# Patient Record
Sex: Female | Born: 1955
Health system: Southern US, Community
[De-identification: ages and names within clinical notes are randomized; demographics above are authoritative.]

## PROBLEM LIST (undated history)

## (undated) DIAGNOSIS — F419 Anxiety disorder, unspecified: Secondary | ICD-10-CM

## (undated) DIAGNOSIS — M5126 Other intervertebral disc displacement, lumbar region: Secondary | ICD-10-CM

## (undated) DIAGNOSIS — C801 Malignant (primary) neoplasm, unspecified: Secondary | ICD-10-CM

## (undated) DIAGNOSIS — M1990 Unspecified osteoarthritis, unspecified site: Secondary | ICD-10-CM

## (undated) DIAGNOSIS — K219 Gastro-esophageal reflux disease without esophagitis: Secondary | ICD-10-CM

## (undated) DIAGNOSIS — G894 Chronic pain syndrome: Secondary | ICD-10-CM

## (undated) DIAGNOSIS — F329 Major depressive disorder, single episode, unspecified: Secondary | ICD-10-CM

## (undated) DIAGNOSIS — F32A Depression, unspecified: Secondary | ICD-10-CM

## (undated) DIAGNOSIS — R519 Headache, unspecified: Secondary | ICD-10-CM

## (undated) DIAGNOSIS — N189 Chronic kidney disease, unspecified: Secondary | ICD-10-CM

## (undated) DIAGNOSIS — R4189 Other symptoms and signs involving cognitive functions and awareness: Secondary | ICD-10-CM

## (undated) HISTORY — PX: TUBAL LIGATION: SHX77

## (undated) HISTORY — PX: COLONOSCOPY: SHX174

## (undated) HISTORY — PX: OTHER SURGICAL HISTORY: SHX169

---

## 1999-03-01 ENCOUNTER — Other Ambulatory Visit: Admission: RE | Admit: 1999-03-01 | Discharge: 1999-03-01 | Payer: Self-pay | Admitting: Obstetrics and Gynecology

## 2000-03-01 ENCOUNTER — Other Ambulatory Visit: Admission: RE | Admit: 2000-03-01 | Discharge: 2000-03-01 | Payer: Self-pay | Admitting: Obstetrics and Gynecology

## 2001-03-03 ENCOUNTER — Other Ambulatory Visit: Admission: RE | Admit: 2001-03-03 | Discharge: 2001-03-03 | Payer: Self-pay | Admitting: Obstetrics and Gynecology

## 2002-03-19 ENCOUNTER — Other Ambulatory Visit: Admission: RE | Admit: 2002-03-19 | Discharge: 2002-03-19 | Payer: Self-pay | Admitting: Family Medicine

## 2004-04-13 ENCOUNTER — Other Ambulatory Visit: Admission: RE | Admit: 2004-04-13 | Discharge: 2004-04-13 | Payer: Self-pay | Admitting: Family Medicine

## 2004-11-07 ENCOUNTER — Encounter: Admission: RE | Admit: 2004-11-07 | Discharge: 2004-11-07 | Payer: Self-pay | Admitting: Family Medicine

## 2005-04-19 ENCOUNTER — Emergency Department (HOSPITAL_COMMUNITY): Admission: EM | Admit: 2005-04-19 | Discharge: 2005-04-19 | Payer: Self-pay | Admitting: Emergency Medicine

## 2005-11-23 ENCOUNTER — Emergency Department (HOSPITAL_COMMUNITY): Admission: EM | Admit: 2005-11-23 | Discharge: 2005-11-24 | Payer: Self-pay | Admitting: Emergency Medicine

## 2007-12-23 ENCOUNTER — Other Ambulatory Visit: Admission: RE | Admit: 2007-12-23 | Discharge: 2007-12-23 | Payer: Self-pay | Admitting: Family Medicine

## 2009-06-30 ENCOUNTER — Other Ambulatory Visit: Admission: RE | Admit: 2009-06-30 | Discharge: 2009-06-30 | Payer: Self-pay | Admitting: Family Medicine

## 2011-02-15 ENCOUNTER — Other Ambulatory Visit: Payer: Self-pay | Admitting: Family Medicine

## 2011-02-15 ENCOUNTER — Other Ambulatory Visit (HOSPITAL_COMMUNITY)
Admission: RE | Admit: 2011-02-15 | Discharge: 2011-02-15 | Disposition: A | Discharge status: Home / Self Care | Payer: 59 | Source: Ambulatory Visit | Attending: Family Medicine | Admitting: Family Medicine

## 2011-02-15 DIAGNOSIS — Z01419 Encounter for gynecological examination (general) (routine) without abnormal findings: Secondary | ICD-10-CM | POA: Insufficient documentation

## 2013-04-07 ENCOUNTER — Other Ambulatory Visit: Payer: Self-pay | Admitting: Family Medicine

## 2013-04-07 DIAGNOSIS — R1011 Right upper quadrant pain: Secondary | ICD-10-CM

## 2013-04-07 DIAGNOSIS — R1031 Right lower quadrant pain: Secondary | ICD-10-CM

## 2013-04-09 ENCOUNTER — Ambulatory Visit
Admission: RE | Admit: 2013-04-09 | Discharge: 2013-04-09 | Disposition: A | Discharge status: Home / Self Care | Payer: BC Managed Care – PPO | Source: Ambulatory Visit | Attending: Family Medicine | Admitting: Family Medicine

## 2013-04-09 DIAGNOSIS — R1031 Right lower quadrant pain: Secondary | ICD-10-CM

## 2013-04-09 DIAGNOSIS — R1011 Right upper quadrant pain: Secondary | ICD-10-CM

## 2013-04-10 ENCOUNTER — Other Ambulatory Visit: Payer: 59

## 2013-07-17 ENCOUNTER — Other Ambulatory Visit: Payer: Self-pay | Admitting: Family Medicine

## 2013-07-17 ENCOUNTER — Other Ambulatory Visit (HOSPITAL_COMMUNITY)
Admission: RE | Admit: 2013-07-17 | Discharge: 2013-07-17 | Disposition: A | Discharge status: Home / Self Care | Payer: BC Managed Care – PPO | Source: Ambulatory Visit | Attending: Family Medicine | Admitting: Family Medicine

## 2013-07-17 DIAGNOSIS — Z124 Encounter for screening for malignant neoplasm of cervix: Secondary | ICD-10-CM | POA: Insufficient documentation

## 2014-08-26 ENCOUNTER — Other Ambulatory Visit (HOSPITAL_COMMUNITY)
Admission: RE | Admit: 2014-08-26 | Discharge: 2014-08-26 | Disposition: A | Discharge status: Home / Self Care | Payer: 59 | Source: Ambulatory Visit | Attending: Family Medicine | Admitting: Family Medicine

## 2014-08-26 ENCOUNTER — Other Ambulatory Visit: Payer: Self-pay | Admitting: Family Medicine

## 2014-08-26 DIAGNOSIS — Z124 Encounter for screening for malignant neoplasm of cervix: Secondary | ICD-10-CM | POA: Diagnosis not present

## 2014-08-31 LAB — CYTOLOGY - PAP

## 2016-07-31 ENCOUNTER — Ambulatory Visit: Payer: Self-pay | Admitting: Orthopedic Surgery

## 2016-07-31 NOTE — Progress Notes (Signed)
Please place orders in EPIC as patient is being scheduled for a Pre-op appointment! Thank you! 

## 2016-08-02 NOTE — Patient Instructions (Addendum)
Kristen Wilcox  08/02/2016   Your procedure is scheduled on: 08-15-16  Report to [facility] Main  Entrance take [facility]  elevators to 3rd floor to  [place] at [phone].  Call this number if you have problems the morning of surgery [phone]   Remember: ONLY 1 PERSON MAY GO WITH YOU TO SHORT STAY TO GET  READY MORNING OF [place].  Do not eat food or drink liquids :After Midnight.     Take these medicines the morning of surgery with A SIP OF WATER: gabapentin(Neurontin), xanax as needed, hydrocodone as needed, cogentin, haloperido(hadol)                                You may not have any metal on your body including hair pins and              piercings  Do not wear jewelry, make-up, lotions, powders or perfumes, deodorant             Do not wear nail polish.  Do not shave  48 hours prior to surgery.              Men may shave face and neck.   Do not bring valuables to the hospital. Kristen Wilcox.  Contacts, dentures or bridgework may not be worn into surgery.  Leave suitcase in the car. After surgery it may be brought to your room.               Please read over the following fact sheets you were given: _____________________________________________________________________             [facility] - Preparing for Surgery Before surgery, you can play an important role.  Because skin is not sterile, your skin needs to be as free of germs as possible.  You can reduce the number of germs on your skin by washing with CHG (chlorahexidine gluconate) soap before surgery.  CHG is an antiseptic cleaner which kills germs and bonds with the skin to continue killing germs even after washing. Please DO NOT use if you have an allergy to CHG or antibacterial soaps.  If your skin becomes reddened/irritated stop using the CHG and inform your nurse when you arrive at Short Stay. Do not shave (including legs and underarms)  for at least 48 hours prior to the first CHG shower.  You may shave your face/neck. Please follow these instructions carefully:  1.  Shower with CHG Soap the night before surgery and the  morning of Surgery.  2.  If you choose to wash your hair, wash your hair first as usual with your  normal  shampoo.  3.  After you shampoo, rinse your hair and body thoroughly to remove the  shampoo.                           4.  Use CHG as you would any other liquid soap.  You can apply chg directly  to the skin and wash                       Gently with a scrungie or clean washcloth.  5.  Apply the CHG Soap to your body ONLY FROM THE NECK DOWN.   Do not use on face/ open                           Wound or open sores. Avoid contact with eyes, ears mouth and genitals (private parts).                       Wash face,  Genitals (private parts) with your normal soap.             6.  Wash thoroughly, paying special attention to the area where your surgery  will be performed.  7.  Thoroughly rinse your body with warm water from the neck down.  8.  DO NOT shower/wash with your normal soap after using and rinsing off  the CHG Soap.                9.  Pat yourself dry with a clean towel.            10.  Wear clean pajamas.            11.  Place clean sheets on your bed the night of your first shower and do not  sleep with pets. Day of Surgery : Do not apply any lotions/deodorants the morning of surgery.  Please wear clean clothes to the hospital/surgery center.  FAILURE TO FOLLOW THESE INSTRUCTIONS MAY RESULT IN THE CANCELLATION OF YOUR SURGERY PATIENT SIGNATURE_________________________________  NURSE SIGNATURE__________________________________  ________________________________________________________________________   Kristen Wilcox  An incentive spirometer is a tool that can help keep your lungs clear and active. This tool measures how well you are filling your lungs with each breath. Taking long deep  breaths may help reverse or decrease the chance of developing breathing (pulmonary) problems (especially infection) following:  A long period of time when you are unable to move or be active. BEFORE THE PROCEDURE   If the spirometer includes an indicator to show your best effort, your nurse or respiratory therapist will set it to a desired goal.  If possible, sit up straight or lean slightly forward. Try not to slouch.  Hold the incentive spirometer in an upright position. INSTRUCTIONS FOR USE  1. Sit on the edge of your bed if possible, or sit up as far as you can in bed or on a chair. 2. Hold the incentive spirometer in an upright position. 3. Breathe out normally. 4. Place the mouthpiece in your mouth and seal your lips tightly around it. 5. Breathe in slowly and as deeply as possible, raising the piston or the ball toward the top of the column. 6. Hold your breath for 3-5 seconds or for as long as possible. Allow the piston or ball to fall to the bottom of the column. 7. Remove the mouthpiece from your mouth and breathe out normally. 8. Rest for a few seconds and repeat Steps 1 through 7 at least 10 times every 1-2 hours when you are awake. Take your time and take a few normal breaths between deep breaths. 9. The spirometer may include an indicator to show your best effort. Use the indicator as a goal to work toward during each repetition. 10. After each set of 10 deep breaths, practice coughing to be sure your lungs are clear. If you have an incision (the cut made at the time of surgery), support your incision when coughing by placing a pillow or  rolled up towels firmly against it. Once you are able to get out of bed, walk around indoors and cough well. You may stop using the incentive spirometer when instructed by your caregiver.  RISKS AND COMPLICATIONS  Take your time so you do not get dizzy or light-headed.  If you are in pain, you may need to take or ask for pain medication before  doing incentive spirometry. It is harder to take a deep breath if you are having pain. AFTER USE  Rest and breathe slowly and easily.  It can be helpful to keep track of a log of your progress. Your caregiver can provide you with a simple table to help with this. If you are using the spirometer at home, follow these instructions: [place] IF:   You are having difficultly using the spirometer.  You have trouble using the spirometer as often as instructed.  Your pain medication is not giving enough relief while using the spirometer.  You develop fever of 100.5 F (38.1 C) or higher. SEEK IMMEDIATE MEDICAL CARE IF:   You cough up bloody sputum that had not been present before.  You develop fever of 102 F (38.9 C) or greater.  You develop worsening pain at or near the incision site. MAKE SURE YOU:   Understand these instructions.  Will watch your condition.  Will get help right away if you are not doing well or get worse. Document Released: 10/08/2006 Document Revised: 08/20/2011 Document Reviewed: 12/09/2006 [facility] Patient Information 2014 [place], [place].   ________________________________________________________________________

## 2016-08-06 ENCOUNTER — Encounter (INDEPENDENT_AMBULATORY_CARE_PROVIDER_SITE_OTHER): Payer: Self-pay

## 2016-08-06 ENCOUNTER — Ambulatory Visit (HOSPITAL_COMMUNITY)
Admission: RE | Admit: 2016-08-06 | Discharge: 2016-08-06 | Disposition: A | Discharge status: Home / Self Care | Payer: 59 | Source: Ambulatory Visit | Attending: Orthopedic Surgery | Admitting: Orthopedic Surgery

## 2016-08-06 ENCOUNTER — Ambulatory Visit: Payer: Self-pay | Admitting: Orthopedic Surgery

## 2016-08-06 ENCOUNTER — Encounter (HOSPITAL_COMMUNITY): Payer: Self-pay

## 2016-08-06 ENCOUNTER — Encounter (HOSPITAL_COMMUNITY)
Admission: RE | Admit: 2016-08-06 | Discharge: 2016-08-06 | Disposition: A | Discharge status: Home / Self Care | Payer: 59 | Source: Ambulatory Visit | Attending: Specialist | Admitting: Specialist

## 2016-08-06 DIAGNOSIS — M419 Scoliosis, unspecified: Secondary | ICD-10-CM | POA: Insufficient documentation

## 2016-08-06 DIAGNOSIS — Z01812 Encounter for preprocedural laboratory examination: Secondary | ICD-10-CM | POA: Diagnosis present

## 2016-08-06 DIAGNOSIS — M5126 Other intervertebral disc displacement, lumbar region: Secondary | ICD-10-CM

## 2016-08-06 DIAGNOSIS — Z01818 Encounter for other preprocedural examination: Secondary | ICD-10-CM | POA: Diagnosis present

## 2016-08-06 DIAGNOSIS — M5136 Other intervertebral disc degeneration, lumbar region: Secondary | ICD-10-CM | POA: Diagnosis not present

## 2016-08-06 HISTORY — DX: Depression, unspecified: F32.A

## 2016-08-06 HISTORY — DX: Major depressive disorder, single episode, unspecified: F32.9

## 2016-08-06 HISTORY — DX: Other intervertebral disc displacement, lumbar region: M51.26

## 2016-08-06 HISTORY — DX: Malignant (primary) neoplasm, unspecified: C80.1

## 2016-08-06 HISTORY — DX: Anxiety disorder, unspecified: F41.9

## 2016-08-06 LAB — SURGICAL PCR SCREEN
MRSA, PCR: NEGATIVE
Staphylococcus aureus: NEGATIVE

## 2016-08-06 LAB — BASIC METABOLIC PANEL
ANION GAP: 5 (ref 5–15)
BUN: 15 mg/dL (ref 6–20)
CALCIUM: 9.4 mg/dL (ref 8.9–10.3)
CHLORIDE: 107 mmol/L (ref 101–111)
CO2: 30 mmol/L (ref 22–32)
Creatinine, Ser: 0.75 mg/dL (ref 0.44–1.00)
GFR calc non Af Amer: >60 mL/min (ref >60)
GLUCOSE: 94 mg/dL (ref 65–99)
POTASSIUM: 4.6 mmol/L (ref 3.5–5.1)
Sodium: 142 mmol/L (ref 135–145)

## 2016-08-06 LAB — CBC
HEMATOCRIT: 40.1 % (ref 36.0–46.0)
HEMOGLOBIN: 13.3 g/dL (ref 12.0–15.0)
MCH: 29.2 pg (ref 26.0–34.0)
MCHC: 33.2 g/dL (ref 30.0–36.0)
MCV: 88.1 fL (ref 78.0–100.0)
Platelets: 216 10*3/uL (ref 150–400)
RBC: 4.55 MIL/uL (ref 3.87–5.11)
RDW: 12.8 % (ref 11.5–15.5)
WBC: 4.5 10*3/uL (ref 4.0–10.5)

## 2016-08-06 NOTE — H&P (Signed)
Kristen Wilcox is an 60 y.o. female.   Chief Complaint: back and left leg pain HPI: The patient is a 60 year old female who presents today for follow up of their back. The patient is being followed for their low back symptoms. They are now 4 week(s) out from when symptoms began. Symptoms reported today include: pain. The patient states that they are doing poorly. Current treatment includes: relative rest, activity modification, pain medications and muscle relaxer. The following medication has been used for pain control: Norco and Robaxin. The patient presents today following MRI.  Kristen Wilcox follows up. She has had a couple of episodes where she had severe pain, could not walk. The leg is giving out on her. Overall, she reports she has been having the symptoms for six months to a year, has been on gabapentin for that, has been attempting home exercises and activity modification. She was seen and treated by Dr. Mitchell. She was also taking hydrocodone for this.  Review of systems is negative for fevers, chest pain, shortness of breath, unexplained recent weight loss, loss of bowel or bladder function, burning with urination, joint swelling, rashes, weakness or numbness, difficulty with balance, easy bruising, excessive thirst or frequent urination.  She reports no numbness, tingling in the upper extremities.  No past medical history on file.  No past surgical history on file.  No family history on file. Social History:  has no tobacco, alcohol, and drug history on file.  Allergies:  Allergies  Allergen Reactions  . Other     Migraine prevention medications Causes tingling in face and throat   . Penicillins Swelling    Has patient had a PCN reaction causing immediate rash, facial/tongue/throat swelling, SOB or lightheadedness with hypotension: Yes Has patient had a PCN reaction causing severe rash involving mucus membranes or skin necrosis: No Has patient had a PCN reaction that required  hospitalization No Has patient had a PCN reaction occurring within the last 10 years: Yes If all of the above answers are "NO", then may proceed with Cephalosporin use.   . Septra [Sulfamethoxazole-Trimethoprim] Other (See Comments)    Feels like head is going to explode      (Not in a hospital admission)  No results found for this or any previous visit (from the past 48 hour(s)). No results found.  Review of Systems  Constitutional: Negative.   HENT: Negative.   Eyes: Negative.   Respiratory: Negative.   Cardiovascular: Negative.   Gastrointestinal: Negative.   Genitourinary: Negative.   Musculoskeletal: Positive for back pain.  Skin: Negative.   Neurological: Positive for sensory change and focal weakness.  Psychiatric/Behavioral: Negative.     There were no vitals taken for this visit. Physical Exam  Constitutional: She is oriented to person, place, and time. She appears well-developed. She appears distressed.  HENT:  Head: Normocephalic.  Eyes: Pupils are equal, round, and reactive to light.  Neck: Normal range of motion.  Cardiovascular: Normal rate.   Respiratory: Effort normal.  GI: Soft.  Musculoskeletal:  She is still in moderate distress. Mood and affect is appropriate. Tender in the left proximal gluteus. Straight leg raise produces marked buttock, thigh and calf pain, exacerbated with dorsal augmentation maneuver. Decreased Achilles reflex on the left. Decreased plantar flexion. She has 4/5 dorsiflexion on the left, compared to the right. Slightly overt sensation in the heel.  Neurological: She is alert and oriented to person, place, and time.    X-rays of the lumbar spine, AP   and lateral flexion and extension demonstrates spondylosis at multiple levels, mild. No instability of flexion and extension. No fracture. Hips are unremarkable  MRI demonstrates an extruded disc herniation at L5-S1 on the left, displaced in the S1 nerve root and also severe lateral  recess stenosis at L5-S1. There is caudal migration, extending into the foramen. There is disc degeneration at L4-5, small protrusion at L3-4.  Assessment/Plan Refractory L5-S1 radiculopathy, myotomal weakness, dermatomal dysesthesias, refractory to rest, activity modification, home exercise program, prednisone, gabapentin, hydrocodone.  Given the persistence of her symptoms in the presence of neurologic deficit with a neural compressive lesion, we discussed a lumbar decompression.  I had an extensive discussion of the risks and benefits of the lumbar decompression with the patient including bleeding, infection, damage to neurovascular structures, epidural fibrosis, CSF leak requiring repair. We also discussed increase in pain, adjacent segment disease, recurrent disc herniation, need for future surgery including repeat decompression and/or fusion. We also discussed risks of postoperative hematoma, paralysis, anesthetic complications including DVT, PE, death, cardiopulmonary dysfunction. In addition, the perioperative and postoperative courses were discussed in detail including the rehabilitative time and return to functional activity and work. I provided the patient with an illustrated handout and utilized the appropriate surgical models.  We discussed residual neuropathic pain and given there has been a fair amount of time, it may take a while to recover. She may have some residual permanent deficit, although I suspect it should return. She is having a difficult time with neural stretch and walking and discussed reducing her stretching. Continue wearing analgesics and rest and prone extensions. She is otherwise pretty healthy. We will proceed accordingly. She is to continue with her hydrocodone to be taken as directed. Discussed this with her husband. I gave her note that she should be out of work.  Plan  microlumbar decompression L5-S1 left  Danh Bayus M., PA-C for Dr. Beane 08/06/2016, 8:34  AM   

## 2016-08-15 ENCOUNTER — Encounter (HOSPITAL_COMMUNITY)
Admission: RE | Disposition: A | Discharge status: Home / Self Care | Payer: Self-pay | Source: Ambulatory Visit | Attending: Specialist

## 2016-08-15 ENCOUNTER — Ambulatory Visit (HOSPITAL_COMMUNITY): Payer: 59

## 2016-08-15 ENCOUNTER — Observation Stay (HOSPITAL_COMMUNITY)
Admission: RE | Admit: 2016-08-15 | Discharge: 2016-08-16 | Disposition: A | Discharge status: Home / Self Care | Payer: 59 | Source: Ambulatory Visit | Attending: Specialist | Admitting: Specialist

## 2016-08-15 ENCOUNTER — Ambulatory Visit (HOSPITAL_COMMUNITY): Payer: 59 | Admitting: Anesthesiology

## 2016-08-15 ENCOUNTER — Encounter (HOSPITAL_COMMUNITY): Payer: Self-pay | Admitting: *Deleted

## 2016-08-15 DIAGNOSIS — Z79899 Other long term (current) drug therapy: Secondary | ICD-10-CM | POA: Diagnosis not present

## 2016-08-15 DIAGNOSIS — M5127 Other intervertebral disc displacement, lumbosacral region: Secondary | ICD-10-CM | POA: Diagnosis not present

## 2016-08-15 DIAGNOSIS — F418 Other specified anxiety disorders: Secondary | ICD-10-CM | POA: Diagnosis not present

## 2016-08-15 DIAGNOSIS — M5126 Other intervertebral disc displacement, lumbar region: Secondary | ICD-10-CM | POA: Diagnosis present

## 2016-08-15 DIAGNOSIS — Z87891 Personal history of nicotine dependence: Secondary | ICD-10-CM | POA: Insufficient documentation

## 2016-08-15 DIAGNOSIS — M5136 Other intervertebral disc degeneration, lumbar region: Secondary | ICD-10-CM | POA: Diagnosis not present

## 2016-08-15 DIAGNOSIS — M4807 Spinal stenosis, lumbosacral region: Secondary | ICD-10-CM | POA: Diagnosis not present

## 2016-08-15 DIAGNOSIS — Z880 Allergy status to penicillin: Secondary | ICD-10-CM | POA: Diagnosis not present

## 2016-08-15 DIAGNOSIS — M549 Dorsalgia, unspecified: Secondary | ICD-10-CM

## 2016-08-15 DIAGNOSIS — Z791 Long term (current) use of non-steroidal anti-inflammatories (NSAID): Secondary | ICD-10-CM | POA: Diagnosis not present

## 2016-08-15 HISTORY — PX: LUMBAR LAMINECTOMY/DECOMPRESSION MICRODISCECTOMY: SHX5026

## 2016-08-15 SURGERY — LUMBAR LAMINECTOMY/DECOMPRESSION MICRODISCECTOMY 1 LEVEL
Anesthesia: General | Site: Back | Laterality: Left

## 2016-08-15 MED ORDER — ONDANSETRON HCL 4 MG/2ML IJ SOLN
INTRAMUSCULAR | Status: DC | PRN
  Administered 2016-08-15: 4 mg via INTRAVENOUS

## 2016-08-15 MED ORDER — HYDROMORPHONE HCL 1 MG/ML IJ SOLN: 0.5000 mg | INTRAMUSCULAR | Status: DC | PRN

## 2016-08-15 MED ORDER — ACETAMINOPHEN 325 MG PO TABS: 650.0000 mg | ORAL_TABLET | ORAL | Status: DC | PRN

## 2016-08-15 MED ORDER — BENZTROPINE MESYLATE 1 MG PO TABS
1.0000 mg | ORAL_TABLET | Freq: Every day | ORAL | Status: DC
  Administered 2016-08-16: 1 mg via ORAL
  Filled 2016-08-15: qty 1

## 2016-08-15 MED ORDER — HYDROMORPHONE HCL 1 MG/ML IJ SOLN
INTRAMUSCULAR | Status: AC
  Administered 2016-08-15: 0.25 mg via INTRAVENOUS
  Filled 2016-08-15: qty 1

## 2016-08-15 MED ORDER — ROCURONIUM BROMIDE 10 MG/ML (PF) SYRINGE
PREFILLED_SYRINGE | INTRAVENOUS | Status: DC | PRN
  Administered 2016-08-15: 30 mg via INTRAVENOUS

## 2016-08-15 MED ORDER — KCL IN DEXTROSE-NACL 20-5-0.45 MEQ/L-%-% IV SOLN
INTRAVENOUS | Status: DC
  Administered 2016-08-15: 16:00:00 via INTRAVENOUS
  Filled 2016-08-15: qty 1000

## 2016-08-15 MED ORDER — PHENYLEPHRINE 40 MCG/ML (10ML) SYRINGE FOR IV PUSH (FOR BLOOD PRESSURE SUPPORT)
PREFILLED_SYRINGE | INTRAVENOUS | Status: DC | PRN
  Administered 2016-08-15 (×2): 80 ug via INTRAVENOUS

## 2016-08-15 MED ORDER — ALPRAZOLAM 0.5 MG PO TABS: 0.5000 mg | ORAL_TABLET | Freq: Three times a day (TID) | ORAL | Status: DC | PRN

## 2016-08-15 MED ORDER — LIDOCAINE 2% (20 MG/ML) 5 ML SYRINGE
INTRAMUSCULAR | Status: DC | PRN
  Administered 2016-08-15: 100 mg via INTRAVENOUS

## 2016-08-15 MED ORDER — PROMETHAZINE HCL 25 MG/ML IJ SOLN: 6.2500 mg | INTRAMUSCULAR | Status: DC | PRN

## 2016-08-15 MED ORDER — VANCOMYCIN HCL IN DEXTROSE 1-5 GM/200ML-% IV SOLN
1000.0000 mg | Freq: Once | INTRAVENOUS | Status: AC
  Administered 2016-08-15: 1000 mg via INTRAVENOUS
  Filled 2016-08-15: qty 200

## 2016-08-15 MED ORDER — ONDANSETRON HCL 4 MG/2ML IJ SOLN: 4.0000 mg | Freq: Four times a day (QID) | INTRAMUSCULAR | Status: DC | PRN

## 2016-08-15 MED ORDER — GABAPENTIN 400 MG PO CAPS
800.0000 mg | ORAL_CAPSULE | Freq: Three times a day (TID) | ORAL | Status: DC
  Administered 2016-08-15 – 2016-08-16 (×3): 800 mg via ORAL
  Filled 2016-08-15 (×3): qty 2

## 2016-08-15 MED ORDER — FENTANYL CITRATE (PF) 100 MCG/2ML IJ SOLN
INTRAMUSCULAR | Status: DC | PRN
Start: 2016-08-15 — End: 2016-08-15
  Administered 2016-08-15: 50 ug via INTRAVENOUS

## 2016-08-15 MED ORDER — ONDANSETRON HCL 4 MG/2ML IJ SOLN
INTRAMUSCULAR | Status: AC
  Filled 2016-08-15: qty 2

## 2016-08-15 MED ORDER — BUPIVACAINE-EPINEPHRINE (PF) 0.5% -1:200000 IJ SOLN
INTRAMUSCULAR | Status: AC
  Filled 2016-08-15: qty 30

## 2016-08-15 MED ORDER — BUPIVACAINE-EPINEPHRINE 0.5% -1:200000 IJ SOLN
INTRAMUSCULAR | Status: DC | PRN
  Administered 2016-08-15: 10 mL

## 2016-08-15 MED ORDER — POLYETHYLENE GLYCOL 3350 17 G PO PACK: 17.0000 g | PACK | Freq: Every day | ORAL | 0 refills | Status: DC

## 2016-08-15 MED ORDER — SUCCINYLCHOLINE CHLORIDE 200 MG/10ML IV SOSY
PREFILLED_SYRINGE | INTRAVENOUS | Status: AC
  Filled 2016-08-15: qty 10

## 2016-08-15 MED ORDER — PHENOL 1.4 % MT LIQD: 1.0000 | OROMUCOSAL | Status: DC | PRN

## 2016-08-15 MED ORDER — SUGAMMADEX SODIUM 200 MG/2ML IV SOLN
INTRAVENOUS | Status: AC
  Filled 2016-08-15: qty 2

## 2016-08-15 MED ORDER — ONDANSETRON HCL 4 MG PO TABS
4.0000 mg | ORAL_TABLET | Freq: Four times a day (QID) | ORAL | Status: DC | PRN
  Administered 2016-08-16: 4 mg via ORAL
  Filled 2016-08-15: qty 1

## 2016-08-15 MED ORDER — BISACODYL 5 MG PO TBEC: 5.0000 mg | DELAYED_RELEASE_TABLET | Freq: Every day | ORAL | Status: DC | PRN

## 2016-08-15 MED ORDER — ACETAMINOPHEN 650 MG RE SUPP: 650.0000 mg | RECTAL | Status: DC | PRN

## 2016-08-15 MED ORDER — DOCUSATE SODIUM 100 MG PO CAPS
100.0000 mg | ORAL_CAPSULE | Freq: Two times a day (BID) | ORAL | Status: DC
  Administered 2016-08-15 – 2016-08-16 (×3): 100 mg via ORAL
  Filled 2016-08-15 (×3): qty 1

## 2016-08-15 MED ORDER — ROCURONIUM BROMIDE 50 MG/5ML IV SOSY
PREFILLED_SYRINGE | INTRAVENOUS | Status: AC
Start: 2016-08-15 — End: 2016-08-15
  Filled 2016-08-15: qty 5

## 2016-08-15 MED ORDER — MAGNESIUM CITRATE PO SOLN: 1.0000 | Freq: Once | ORAL | Status: DC | PRN

## 2016-08-15 MED ORDER — HALOPERIDOL 0.5 MG PO TABS
0.5000 mg | ORAL_TABLET | Freq: Three times a day (TID) | ORAL | Status: DC
  Administered 2016-08-15 – 2016-08-16 (×2): 0.5 mg via ORAL
  Filled 2016-08-15 (×4): qty 1

## 2016-08-15 MED ORDER — MIDAZOLAM HCL 2 MG/2ML IJ SOLN
INTRAMUSCULAR | Status: AC
Start: 2016-08-15 — End: 2016-08-15
  Filled 2016-08-15: qty 2

## 2016-08-15 MED ORDER — DEXAMETHASONE SODIUM PHOSPHATE 10 MG/ML IJ SOLN
INTRAMUSCULAR | Status: DC | PRN
  Administered 2016-08-15: 10 mg via INTRAVENOUS

## 2016-08-15 MED ORDER — SUCCINYLCHOLINE CHLORIDE 200 MG/10ML IV SOSY
PREFILLED_SYRINGE | INTRAVENOUS | Status: DC | PRN
  Administered 2016-08-15: 100 mg via INTRAVENOUS

## 2016-08-15 MED ORDER — MENTHOL 3 MG MT LOZG: 1.0000 | LOZENGE | OROMUCOSAL | Status: DC | PRN

## 2016-08-15 MED ORDER — METHOCARBAMOL 1000 MG/10ML IJ SOLN
500.0000 mg | Freq: Four times a day (QID) | INTRAVENOUS | Status: DC | PRN
  Administered 2016-08-15: 500 mg via INTRAVENOUS
  Filled 2016-08-15: qty 550
  Filled 2016-08-15: qty 5

## 2016-08-15 MED ORDER — SUGAMMADEX SODIUM 200 MG/2ML IV SOLN
INTRAVENOUS | Status: DC | PRN
  Administered 2016-08-15: 150 mg via INTRAVENOUS

## 2016-08-15 MED ORDER — LACTATED RINGERS IV SOLN
INTRAVENOUS | Status: DC
  Administered 2016-08-15 (×2): via INTRAVENOUS

## 2016-08-15 MED ORDER — PROPOFOL 10 MG/ML IV BOLUS
INTRAVENOUS | Status: AC
  Filled 2016-08-15: qty 20

## 2016-08-15 MED ORDER — VANCOMYCIN HCL IN DEXTROSE 1-5 GM/200ML-% IV SOLN
1000.0000 mg | INTRAVENOUS | Status: AC
  Administered 2016-08-15: 1000 mg via INTRAVENOUS
  Filled 2016-08-15: qty 200

## 2016-08-15 MED ORDER — RISAQUAD PO CAPS
1.0000 | ORAL_CAPSULE | Freq: Every day | ORAL | Status: DC
  Administered 2016-08-15 – 2016-08-16 (×2): 1 via ORAL
  Filled 2016-08-15 (×2): qty 1

## 2016-08-15 MED ORDER — METHOCARBAMOL 500 MG PO TABS
500.0000 mg | ORAL_TABLET | Freq: Four times a day (QID) | ORAL | Status: DC | PRN
  Administered 2016-08-15 – 2016-08-16 (×3): 500 mg via ORAL
  Filled 2016-08-15 (×3): qty 1

## 2016-08-15 MED ORDER — HYDROMORPHONE HCL 1 MG/ML IJ SOLN
0.2500 mg | INTRAMUSCULAR | Status: DC | PRN
  Administered 2016-08-15: 0.5 mg via INTRAVENOUS
  Administered 2016-08-15 (×2): 0.25 mg via INTRAVENOUS

## 2016-08-15 MED ORDER — DEXAMETHASONE SODIUM PHOSPHATE 10 MG/ML IJ SOLN
INTRAMUSCULAR | Status: AC
  Filled 2016-08-15: qty 1

## 2016-08-15 MED ORDER — THROMBIN 5000 UNITS EX SOLR
OROMUCOSAL | Status: DC | PRN
  Administered 2016-08-15: 10000 mL via TOPICAL

## 2016-08-15 MED ORDER — CEFAZOLIN SODIUM-DEXTROSE 2-4 GM/100ML-% IV SOLN: 2.0000 g | Freq: Three times a day (TID) | INTRAVENOUS | Status: DC

## 2016-08-15 MED ORDER — MIDAZOLAM HCL 5 MG/5ML IJ SOLN
INTRAMUSCULAR | Status: DC | PRN
  Administered 2016-08-15: 2 mg via INTRAVENOUS

## 2016-08-15 MED ORDER — SODIUM CHLORIDE 0.9 % IR SOLN
Status: DC | PRN
  Administered 2016-08-15: 500 mL

## 2016-08-15 MED ORDER — FENTANYL CITRATE (PF) 100 MCG/2ML IJ SOLN
50.0000 ug | INTRAMUSCULAR | Status: AC
  Administered 2016-08-15 (×2): 50 ug via INTRAVENOUS

## 2016-08-15 MED ORDER — METHOCARBAMOL 500 MG PO TABS: 500.0000 mg | ORAL_TABLET | Freq: Four times a day (QID) | ORAL | 1 refills | Status: DC | PRN

## 2016-08-15 MED ORDER — OXYCODONE HCL 5 MG PO TABS
5.0000 mg | ORAL_TABLET | ORAL | Status: DC | PRN
  Administered 2016-08-15: 10 mg via ORAL
  Administered 2016-08-15: 5 mg via ORAL
  Administered 2016-08-15 – 2016-08-16 (×4): 10 mg via ORAL
  Filled 2016-08-15 (×4): qty 2
  Filled 2016-08-15: qty 1
  Filled 2016-08-15: qty 2

## 2016-08-15 MED ORDER — PROPOFOL 10 MG/ML IV BOLUS
INTRAVENOUS | Status: DC | PRN
  Administered 2016-08-15: 150 mg via INTRAVENOUS

## 2016-08-15 MED ORDER — POLYETHYLENE GLYCOL 3350 17 G PO PACK: 17.0000 g | PACK | Freq: Every day | ORAL | Status: DC | PRN

## 2016-08-15 MED ORDER — POLYMYXIN B SULFATE 500000 UNITS IJ SOLR
INTRAMUSCULAR | Status: AC
  Filled 2016-08-15: qty 500000

## 2016-08-15 MED ORDER — FENTANYL CITRATE (PF) 100 MCG/2ML IJ SOLN
INTRAMUSCULAR | Status: AC
  Filled 2016-08-15: qty 2

## 2016-08-15 MED ORDER — DOCUSATE SODIUM 100 MG PO CAPS: 100.0000 mg | ORAL_CAPSULE | Freq: Two times a day (BID) | ORAL | 1 refills | Status: DC | PRN

## 2016-08-15 MED ORDER — ALUM & MAG HYDROXIDE-SIMETH 200-200-20 MG/5ML PO SUSP: 30.0000 mL | Freq: Four times a day (QID) | ORAL | Status: DC | PRN

## 2016-08-15 MED ORDER — THROMBIN 5000 UNITS EX SOLR
CUTANEOUS | Status: AC
  Filled 2016-08-15: qty 10000

## 2016-08-15 MED ORDER — LIDOCAINE 2% (20 MG/ML) 5 ML SYRINGE
INTRAMUSCULAR | Status: AC
  Filled 2016-08-15: qty 5

## 2016-08-15 MED ORDER — MEPERIDINE HCL 50 MG/ML IJ SOLN: 6.2500 mg | INTRAMUSCULAR | Status: DC | PRN

## 2016-08-15 MED ORDER — OXYCODONE-ACETAMINOPHEN 5-325 MG PO TABS: 1.0000 | ORAL_TABLET | ORAL | 0 refills | Status: DC | PRN

## 2016-08-15 MED ORDER — PHENYLEPHRINE 40 MCG/ML (10ML) SYRINGE FOR IV PUSH (FOR BLOOD PRESSURE SUPPORT)
PREFILLED_SYRINGE | INTRAVENOUS | Status: AC
  Filled 2016-08-15: qty 10

## 2016-08-15 SURGICAL SUPPLY — 48 items
BAG SPEC THK2 15X12 ZIP CLS (MISCELLANEOUS)
BAG ZIPLOCK 12X15 (MISCELLANEOUS) IMPLANT
CLEANER TIP ELECTROSURG 2X2 (MISCELLANEOUS) ×2 IMPLANT
CLOTH 2% CHLOROHEXIDINE 3PK (PERSONAL CARE ITEMS) ×2 IMPLANT
DRAPE MICROSCOPE LEICA (MISCELLANEOUS) ×2 IMPLANT
DRAPE POUCH INSTRU U-SHP 10X18 (DRAPES) ×2 IMPLANT
DRAPE SHEET LG 3/4 BI-LAMINATE (DRAPES) ×2 IMPLANT
DRAPE SURG 17X11 SM STRL (DRAPES) ×2 IMPLANT
DRAPE UTILITY XL STRL (DRAPES) ×2 IMPLANT
DRSG AQUACEL AG ADV 3.5X 4 (GAUZE/BANDAGES/DRESSINGS) ×2 IMPLANT
DRSG AQUACEL AG ADV 3.5X 6 (GAUZE/BANDAGES/DRESSINGS) IMPLANT
DURAPREP 26ML APPLICATOR (WOUND CARE) ×2 IMPLANT
DURASEAL SPINE SEALANT 3ML (MISCELLANEOUS) IMPLANT
ELECT BLADE TIP CTD 4 INCH (ELECTRODE) ×2 IMPLANT
ELECT REM PT RETURN 9FT ADLT (ELECTROSURGICAL) ×2
ELECTRODE REM PT RTRN 9FT ADLT (ELECTROSURGICAL) ×1 IMPLANT
GLOVE BIOGEL PI IND STRL 7.0 (GLOVE) ×1 IMPLANT
GLOVE BIOGEL PI INDICATOR 7.0 (GLOVE) ×1
GLOVE SURG SS PI 7.0 STRL IVOR (GLOVE) ×2 IMPLANT
GLOVE SURG SS PI 7.5 STRL IVOR (GLOVE) ×2 IMPLANT
GLOVE SURG SS PI 8.0 STRL IVOR (GLOVE) ×4 IMPLANT
GOWN STRL REUS W/TWL XL LVL3 (GOWN DISPOSABLE) ×4 IMPLANT
HEMOSTAT SPONGE AVITENE ULTRA (HEMOSTASIS) IMPLANT
IV CATH 14GX2 1/4 (CATHETERS) ×2 IMPLANT
KIT BASIN OR (CUSTOM PROCEDURE TRAY) ×2 IMPLANT
KIT POSITIONING SURG ANDREWS (MISCELLANEOUS) ×2 IMPLANT
MANIFOLD NEPTUNE II (INSTRUMENTS) ×2 IMPLANT
NEEDLE SPNL 18GX3.5 QUINCKE PK (NEEDLE) ×4 IMPLANT
PACK LAMINECTOMY ORTHO (CUSTOM PROCEDURE TRAY) ×2 IMPLANT
PATTIES SURGICAL .5 X.5 (GAUZE/BANDAGES/DRESSINGS) IMPLANT
PATTIES SURGICAL .75X.75 (GAUZE/BANDAGES/DRESSINGS) ×2 IMPLANT
PATTIES SURGICAL 1X1 (DISPOSABLE) IMPLANT
RUBBERBAND STERILE (MISCELLANEOUS) ×4 IMPLANT
SPONGE SURGIFOAM ABS GEL 100 (HEMOSTASIS) ×2 IMPLANT
STAPLER VISISTAT (STAPLE) IMPLANT
STRIP CLOSURE SKIN 1/2X4 (GAUZE/BANDAGES/DRESSINGS) ×2 IMPLANT
SUT NURALON 4 0 TR CR/8 (SUTURE) IMPLANT
SUT PROLENE 3 0 PS 2 (SUTURE) ×2 IMPLANT
SUT VIC AB 1 CT1 27 (SUTURE) ×2
SUT VIC AB 1 CT1 27XBRD ANTBC (SUTURE) ×1 IMPLANT
SUT VIC AB 1-0 CT2 27 (SUTURE) ×2 IMPLANT
SUT VIC AB 2-0 CT1 27 (SUTURE) ×2
SUT VIC AB 2-0 CT1 TAPERPNT 27 (SUTURE) ×1 IMPLANT
SUT VIC AB 2-0 CT2 27 (SUTURE) IMPLANT
SYR 3ML LL SCALE MARK (SYRINGE) ×2 IMPLANT
TOWEL OR 17X26 10 PK STRL BLUE (TOWEL DISPOSABLE) ×2 IMPLANT
TOWEL OR NON WOVEN STRL DISP B (DISPOSABLE) ×2 IMPLANT
YANKAUER SUCT BULB TIP NO VENT (SUCTIONS) ×2 IMPLANT

## 2016-08-15 NOTE — Interval H&P Note (Signed)
History and Physical Interval Note:  08/15/2016 8:42 AM  Kristen Wilcox  has presented today for surgery, with the diagnosis of HNP, Stenosis L5-S1 left  The various methods of treatment have been discussed with the patient and family. After consideration of risks, benefits and other options for treatment, the patient has consented to  Procedure(s) with comments: Microlumbar decompression L5-S1 left (Left) - 90 mins as a surgical intervention .  The patient's history has been reviewed, patient examined, no change in status, stable for surgery.  I have reviewed the patient's chart and labs.  Questions were answered to the patient's satisfaction.     Gedalia Mcmillon C

## 2016-08-15 NOTE — Transfer of Care (Signed)
Immediate Anesthesia Transfer of Care Note  Patient: Kristen Wilcox  Procedure(s) Performed: Procedure(s) with comments: Microlumbar decompression L4-L5, L5-S1 left (Left) - 90 mins  Patient Location: PACU  Anesthesia Type:General  Level of Consciousness: sedated  Airway & Oxygen Therapy: Patient Spontanous Breathing and Patient connected to face mask oxygen  Post-op Assessment: Report given to RN and Post -op Vital signs reviewed and stable  Post vital signs: Reviewed and stable  Last Vitals:  Vitals:   08/15/16 0900  BP: 127/79  Pulse: 83  Resp: 18  Temp: 36.6 C    Last Pain:  Vitals:   08/15/16 1130  TempSrc:   PainSc: 1       Patients Stated Pain Goal: 5 (82/95/62 1308)  Complications: No apparent anesthesia complications

## 2016-08-15 NOTE — Brief Op Note (Signed)
08/15/2016  1:05 PM  PATIENT:  Kristen Wilcox  61 y.o. female  PRE-OPERATIVE DIAGNOSIS:  HNP, Stenosis L5-S1 left  POST-OPERATIVE DIAGNOSIS:  HNP, Stenosis L5-S1 left  PROCEDURE:  Procedure(s) with comments: Microlumbar decompression L4-L5, L5-S1 left (Left) - 90 mins  SURGEON:  Surgeon(s) and Role:    * Susa Day, MD - Primary  PHYSICIAN ASSISTANT:   ASSISTANTS: [place]   ANESTHESIA:   general  EBL:  No intake/output data recorded.  BLOOD ADMINISTERED:none  DRAINS: none   LOCAL MEDICATIONS USED:  MARCAINE     SPECIMEN:  Source of Specimen:  L5S1  DISPOSITION OF SPECIMEN:  PATHOLOGY  COUNTS:  YES  TOURNIQUET:  * No tourniquets in log *  DICTATION: .Other Dictation: Dictation Number [id number] unable to hear  PLAN OF CARE: Admit for overnight observation  PATIENT DISPOSITION:  PACU - hemodynamically stable.   Delay start of Pharmacological VTE agent (>24hrs) due to surgical blood loss or risk of bleeding: yes

## 2016-08-15 NOTE — Anesthesia Postprocedure Evaluation (Signed)
Anesthesia Post Note  Patient: Kristen Wilcox  Procedure(s) Performed: Procedure(s) (LRB): Microlumbar decompression L4-L5, L5-S1 left (Left)  Patient location during evaluation: PACU Anesthesia Type: General Level of consciousness: sedated and patient cooperative Pain management: pain level controlled Vital Signs Assessment: post-procedure vital signs reviewed and stable Respiratory status: spontaneous breathing Cardiovascular status: stable Anesthetic complications: no       Last Vitals:  Vitals:   08/15/16 1415 08/15/16 1430  BP: (!) 134/51 137/71  Pulse: 66   Resp: 13   Temp:  36.6 C    Last Pain:  Vitals:   08/15/16 1430  TempSrc:   PainSc: [place]

## 2016-08-15 NOTE — Discharge Instructions (Signed)
Walk As Tolerated utilizing back precautions.  No bending, twisting, or lifting.  No driving for 2 weeks.   °Aquacel dressing may remain in place until follow up. May shower with aquacel dressing in place. If the dressing peels off or becomes saturated, you may remove aquacel dressing and place gauze and tape dressing which should be kept clean and dry and changed daily. Do not remove steri-strips if they are present. °See Dr. Nevada Kirchner in office in 10 to 14 days. Begin taking aspirin 81mg per day starting 4 days after your surgery if not allergic to aspirin or on another blood thinner. °Walk daily even outside. Use a cane or walker only if necessary. °Avoid sitting on soft sofas. ° °

## 2016-08-15 NOTE — H&P (View-Only) (Signed)
Kristen Wilcox is an 61 y.o. female.   Chief Complaint: back and left leg pain HPI: The patient is a 61 year old female who presents today for follow up of their back. The patient is being followed for their low back symptoms. They are now 4 week(s) out from when symptoms began. Symptoms reported today include: pain. The patient states that they are doing poorly. Current treatment includes: relative rest, activity modification, pain medications and muscle relaxer. The following medication has been used for pain control: Norco and Robaxin. The patient presents today following MRI.  Kristen Wilcox follows up. She has had a couple of episodes where she had severe pain, could not walk. The leg is giving out on her. Overall, she reports she has been having the symptoms for six months to a year, has been on gabapentin for that, has been attempting home exercises and activity modification. She was seen and treated by Dr. Alroy Dust. She was also taking hydrocodone for this.  Review of systems is negative for fevers, chest pain, shortness of breath, unexplained recent weight loss, loss of bowel or bladder function, burning with urination, joint swelling, rashes, weakness or numbness, difficulty with balance, easy bruising, excessive thirst or frequent urination.  She reports no numbness, tingling in the upper extremities.  No past medical history on file.  No past surgical history on file.  No family history on file. Social History:  has no tobacco, alcohol, and drug history on file.  Allergies:  Allergies  Allergen Reactions  . Other     Migraine prevention medications Causes tingling in face and throat   . Penicillins Swelling    Has patient had a PCN reaction causing immediate rash, facial/tongue/throat swelling, SOB or lightheadedness with hypotension: Yes Has patient had a PCN reaction causing severe rash involving mucus membranes or skin necrosis: No Has patient had a PCN reaction that required  hospitalization No Has patient had a PCN reaction occurring within the last 10 years: Yes If all of the above answers are "NO", then may proceed with Cephalosporin use.   Sarina Ill [Sulfamethoxazole-Trimethoprim] Other (See Comments)    Feels like head is going to explode      (Not in a hospital admission)  No results found for this or any previous visit (from the past 48 hour(s)). No results found.  Review of Systems  Constitutional: Negative.   HENT: Negative.   Eyes: Negative.   Respiratory: Negative.   Cardiovascular: Negative.   Gastrointestinal: Negative.   Genitourinary: Negative.   Musculoskeletal: Positive for back pain.  Skin: Negative.   Neurological: Positive for sensory change and focal weakness.  Psychiatric/Behavioral: Negative.     There were no vitals taken for this visit. Physical Exam  Constitutional: She is oriented to person, place, and time. She appears well-developed. She appears distressed.  HENT:  Head: Normocephalic.  Eyes: Pupils are equal, round, and reactive to light.  Neck: Normal range of motion.  Cardiovascular: Normal rate.   Respiratory: Effort normal.  GI: Soft.  Musculoskeletal:  She is still in moderate distress. Mood and affect is appropriate. Tender in the left proximal gluteus. Straight leg raise produces marked buttock, thigh and calf pain, exacerbated with dorsal augmentation maneuver. Decreased Achilles reflex on the left. Decreased plantar flexion. She has 4/5 dorsiflexion on the left, compared to the right. Slightly overt sensation in the heel.  Neurological: She is alert and oriented to person, place, and time.    X-rays of the lumbar spine, AP  and lateral flexion and extension demonstrates spondylosis at multiple levels, mild. No instability of flexion and extension. No fracture. Hips are unremarkable  MRI demonstrates an extruded disc herniation at L5-S1 on the left, displaced in the S1 nerve root and also severe lateral  recess stenosis at L5-S1. There is caudal migration, extending into the foramen. There is disc degeneration at L4-5, small protrusion at L3-4.  Assessment/Plan Refractory L5-S1 radiculopathy, myotomal weakness, dermatomal dysesthesias, refractory to rest, activity modification, home exercise program, prednisone, gabapentin, hydrocodone.  Given the persistence of her symptoms in the presence of neurologic deficit with a neural compressive lesion, we discussed a lumbar decompression.  I had an extensive discussion of the risks and benefits of the lumbar decompression with the patient including bleeding, infection, damage to neurovascular structures, epidural fibrosis, CSF leak requiring repair. We also discussed increase in pain, adjacent segment disease, recurrent disc herniation, need for future surgery including repeat decompression and/or fusion. We also discussed risks of postoperative hematoma, paralysis, anesthetic complications including DVT, PE, death, cardiopulmonary dysfunction. In addition, the perioperative and postoperative courses were discussed in detail including the rehabilitative time and return to functional activity and work. I provided the patient with an illustrated handout and utilized the appropriate surgical models.  We discussed residual neuropathic pain and given there has been a fair amount of time, it may take a while to recover. She may have some residual permanent deficit, although I suspect it should return. She is having a difficult time with neural stretch and walking and discussed reducing her stretching. Continue wearing analgesics and rest and prone extensions. She is otherwise pretty healthy. We will proceed accordingly. She is to continue with her hydrocodone to be taken as directed. Discussed this with her husband. I gave her note that she should be out of work.  Plan  microlumbar decompression L5-S1 left  Cecilie Kicks., PA-C for Dr. Tonita Cong 08/06/2016, 8:34  AM

## 2016-08-15 NOTE — Anesthesia Procedure Notes (Signed)
Procedure Name: Intubation Date/Time: 08/15/2016 11:53 AM Performed by: Lind Covert Pre-anesthesia Checklist: Patient identified, Emergency Drugs available, Patient being monitored, Suction available and Timeout performed Patient Re-evaluated:Patient Re-evaluated prior to inductionOxygen Delivery Method: Circle system utilized Preoxygenation: Pre-oxygenation with 100% oxygen Intubation Type: IV induction Laryngoscope Size: Mac and 3 Grade View: Grade I Tube type: Oral Tube size: 7.0 mm Number of attempts: 1 Airway Equipment and Method: Stylet Placement Confirmation: ETT inserted through vocal cords under direct vision,  positive ETCO2 and breath sounds checked- equal and bilateral Secured at: 22 cm Tube secured with: Tape Dental Injury: Teeth and Oropharynx as per pre-operative assessment

## 2016-08-15 NOTE — Anesthesia Preprocedure Evaluation (Signed)
Anesthesia Evaluation  Patient identified by MRN, date of birth, ID band Patient awake    Reviewed: Allergy & Precautions, NPO status , Patient's Chart, lab work & pertinent test results  Airway Mallampati: II  TM Distance: >3 FB Neck ROM: Full    Dental no notable dental hx.    Pulmonary former smoker,    Pulmonary exam normal breath sounds clear to auscultation       Cardiovascular negative cardio ROS Normal cardiovascular exam Rhythm:Regular Rate:Normal     Neuro/Psych PSYCHIATRIC DISORDERS Anxiety Depression negative neurological ROS     GI/Hepatic negative GI ROS, Neg liver ROS,   Endo/Other  negative endocrine ROS  Renal/GU negative Renal ROS     Musculoskeletal negative musculoskeletal ROS (+)   Abdominal   Peds  Hematology negative hematology ROS (+)   Anesthesia Other Findings   Reproductive/Obstetrics negative OB ROS                             Anesthesia Physical Anesthesia Plan  ASA: II  Anesthesia Plan: General   Post-op Pain Management:    Induction: Intravenous  Airway Management Planned: LMA  Additional Equipment:   Intra-op Plan:   Post-operative Plan: Extubation in OR  Informed Consent: I have reviewed the patients History and Physical, chart, labs and discussed the procedure including the risks, benefits and alternatives for the proposed anesthesia with the patient or authorized representative who has indicated his/her understanding and acceptance.   Dental advisory given  Plan Discussed with: CRNA  Anesthesia Plan Comments:         Anesthesia Quick Evaluation

## 2016-08-16 DIAGNOSIS — M5127 Other intervertebral disc displacement, lumbosacral region: Secondary | ICD-10-CM | POA: Diagnosis not present

## 2016-08-16 LAB — BASIC METABOLIC PANEL
ANION GAP: 5 (ref 5–15)
BUN: 8 mg/dL (ref 6–20)
CHLORIDE: 104 mmol/L (ref 101–111)
CO2: 30 mmol/L (ref 22–32)
CREATININE: 0.68 mg/dL (ref 0.44–1.00)
Calcium: 9.1 mg/dL (ref 8.9–10.3)
GFR calc non Af Amer: >60 mL/min (ref >60)
Glucose, Bld: 141 mg/dL — ABNORMAL HIGH (ref 65–99)
POTASSIUM: 4 mmol/L (ref 3.5–5.1)
SODIUM: 139 mmol/L (ref 135–145)

## 2016-08-16 MED ORDER — DICLOFENAC SODIUM 75 MG PO TBEC: 75.0000 mg | DELAYED_RELEASE_TABLET | Freq: Two times a day (BID) | ORAL | Status: DC

## 2016-08-16 NOTE — Progress Notes (Signed)
Subjective: 1 Day Post-Op Procedure(s) (LRB): Microlumbar decompression L4-L5, L5-S1 left (Left) Patient reports pain as moderate.   Reports incisional pain controlled by pain meds. Leg pain improved. No other c/o. Seen by myself and Dr. Tonita Cong this AM.   Objective: Vital signs in last 24 hours: Temp:  [97.4 F (36.3 C)-98.7 F (37.1 C)] 98.2 F (36.8 C) (03/08 0534) Pulse Rate:  [63-76] 70 (03/08 0534) Resp:  [12-19] 17 (03/08 0534) BP: (88-144)/(49-102) 117/66 (03/08 0534) SpO2:  [98 %-100 %] 98 % (03/08 0534)  Intake/Output from previous day: 03/07 0701 - 03/08 0700 In: 2613.3 [P.O.:600; I.V.:1963.3; IV Piggyback:50] Out: 57 [Urine:4100; Blood:20] Intake/Output this shift: Total I/O In: -  Out: 400 [Urine:400]  No results for input(s): HGB in the last 72 hours. No results for input(s): WBC, RBC, HCT, PLT in the last 72 hours.  Recent Labs  08/16/16 0418  NA 139  K 4.0  CL 104  CO2 30  BUN 8  CREATININE 0.68  GLUCOSE 141*  CALCIUM 9.1   No results for input(s): LABPT, INR in the last 72 hours.  Neurologically intact ABD soft Neurovascular intact Sensation intact distally Intact pulses distally Dorsiflexion/Plantar flexion intact Incision: dressing C/D/I and no drainage No cellulitis present Compartment soft no calf pain or sign of DVT  Assessment/Plan: 1 Day Post-Op Procedure(s) (LRB): Microlumbar decompression L4-L5, L5-S1 left (Left) Advance diet Up with therapy D/C IV fluids  Discussed D/C instructions, dressing instructions, Lspine precautions D/C home today Follow up outpatient 10-14 days  BISSELL, JACLYN M. 08/16/2016, 9:59 AM

## 2016-08-16 NOTE — Evaluation (Signed)
Occupational Therapy Evaluation Patient Details Name: Kristen Wilcox MRN: [id number] DOB: 1956-04-10 Today's Date: 08/16/2016    History of Present Illness 61 year old female s/p L4-5, L5-S1 decompression   Clinical Impression   This 61 year old female was admitted for the above sx. All education was completed. No further OT is needed at this time    Follow Up Recommendations  No OT follow up    Equipment Recommendations  3 in 1 bedside commode    Recommendations for Other Services       Precautions / Restrictions Precautions Precautions: Fall;Back Restrictions Weight Bearing Restrictions: No      Mobility Bed Mobility               General bed mobility comments: oob by nursing.  Pt verbalizes technique  Transfers Overall transfer level: Needs assistance Equipment used: Rolling walker (2 wheeled) Transfers: Sit to/from Stand Sit to Stand: Min guard;Supervision         General transfer comment: pt moves slowly.  cues for UE placement and back precautions    Balance                                            ADL Overall ADL's : Needs assistance/impaired Eating/Feeding: Independent   Grooming: Supervision/safety;Standing   Upper Body Bathing: Set up;Sitting;Supervision/ safety   Lower Body Bathing: Sit to/from stand;Moderate assistance   Upper Body Dressing : Set up;Supervision/safety;Sitting   Lower Body Dressing: Maximal assistance;Sit to/from stand   Toilet Transfer: Min guard;RW;BSC;Ambulation   Toileting- Water quality scientist and Hygiene: Minimal assistance;Bed level         General ADL Comments: educated on back precautions and gave handout. Assisted with underwear. Husband can assist her at home.  Stopped by to see if she or husband had any questions as he was not present during evaluation. She will benefit from 3:1 over toilet and she can use this as a shower seat. She doesn't feel comfortable with one from her mother  as it doesn't have arm rests     Vision         Perception     Praxis      Pertinent Vitals/Pain Pain Assessment: Faces Faces Pain Scale: Hurts little more Pain Location: back Pain Descriptors / Indicators: Grimacing Pain Intervention(s): Limited activity within patient's tolerance;Monitored during session;Premedicated before session;Repositioned;Ice applied     Hand Dominance     Extremity/Trunk Assessment Upper Extremity Assessment Upper Extremity Assessment: Overall WFL for tasks assessed           Communication Communication Communication: No difficulties   Cognition Arousal/Alertness: Awake/alert Behavior During Therapy: WFL for tasks assessed/performed Overall Cognitive Status: Within Functional Limits for tasks assessed (needed some repetition of cues/info.  Likely med related)                     General Comments       Exercises       Shoulder Instructions      Home Living Family/patient expects to be discharged to:: Private residence Living Arrangements: Spouse/significant other;Children Available Help at Discharge: Family               Bathroom Shower/Tub: Walk-in Psychologist, prison and probation services: Standard     Home Equipment: Shower seat   Additional Comments: no back on seat--from mother      Prior  Functioning/Environment Level of Independence: Independent                 OT Problem List:        OT Treatment/Interventions:      OT Goals(Current goals can be found in the care plan section) Acute Rehab OT Goals Patient Stated Goal: get back healed OT Goal Formulation: All assessment and education complete, DC therapy  OT Frequency:     Barriers to D/C:            Co-evaluation              End of Session    Activity Tolerance: Patient tolerated treatment well Patient left: in chair;with call bell/phone within reach  OT Visit Diagnosis: Muscle weakness (generalized) (M62.81)                ADL either  performed or assessed with clinical judgement  Time: 4103-0131 OT Time Calculation (min): 34 min Charges:  OT General Charges $OT Visit: 1 Procedure OT Evaluation $OT Eval Low Complexity: 1 Procedure OT Treatments $Self Care/Home Management : 8-22 mins G-Codes: OT G-codes **NOT FOR INPATIENT CLASS** Functional Assessment Tool Used: AM-PAC 6 Clicks Daily Activity;Clinical judgement Functional Limitation: Self care Self Care Current Status (Y3888): At least 40 percent but less than 60 percent impaired, limited or restricted Self Care Goal Status (L5797): At least 40 percent but less than 60 percent impaired, limited or restricted Self Care Discharge Status [phone]): At least 40 percent but less than 60 percent impaired, limited or restricted   Lesle Chris, OTR/L [phone] 08/16/2016  Kristen Wilcox 08/16/2016, 10:28 AM

## 2016-08-16 NOTE — Discharge Summary (Signed)
Physician Discharge Summary   Patient ID: Kristen Wilcox MRN: [id number] DOB/AGE: 62/10/1955 62 y.o.  Admit date: 08/15/2016 Discharge date: 08/16/2016  Primary Diagnosis:   HNP, Stenosis L5-S1 left  Admission Diagnoses:  Past Medical History:  Diagnosis Date  . Anxiety   . Cancer ([facility])    skin cancer  . Depression   . Lumbar herniated disc     l5 to s1   Discharge Diagnoses:   Active Problems:   HNP (herniated nucleus pulposus), lumbar  Procedure:  Procedure(s) (LRB): Microlumbar decompression L4-L5, L5-S1 left (Left)   Consults: None  HPI:  see H&P    Laboratory Data: Hospital Outpatient Visit on 08/06/2016  Component Date Value Ref Range Status  . Sodium 08/06/2016 142  135 - 145 mmol/L Final  . Potassium 08/06/2016 4.6  3.5 - 5.1 mmol/L Final  . Chloride 08/06/2016 107  101 - 111 mmol/L Final  . CO2 08/06/2016 30  22 - 32 mmol/L Final  . Glucose, Bld 08/06/2016 94  65 - 99 mg/dL Final  . BUN 08/06/2016 15  6 - 20 mg/dL Final  . Creatinine, Ser 08/06/2016 0.75  0.44 - 1.00 mg/dL Final  . Calcium 08/06/2016 9.4  8.9 - 10.3 mg/dL Final  . GFR calc non Af Amer 08/06/2016 >60  >60 mL/min Final  . GFR calc Af Amer 08/06/2016 >60  >60 mL/min Final   Comment: (NOTE) The eGFR has been calculated using the CKD EPI equation. This calculation has not been validated in all clinical situations. eGFR's persistently <60 mL/min signify possible Chronic Kidney Disease.   . Anion gap 08/06/2016 5  5 - 15 Final  . WBC 08/06/2016 4.5  4.0 - 10.5 K/uL Final  . RBC 08/06/2016 4.55  3.87 - 5.11 MIL/uL Final  . Hemoglobin 08/06/2016 13.3  12.0 - 15.0 g/dL Final  . HCT 08/06/2016 40.1  36.0 - 46.0 % Final  . MCV 08/06/2016 88.1  78.0 - 100.0 fL Final  . MCH 08/06/2016 29.2  26.0 - 34.0 pg Final  . MCHC 08/06/2016 33.2  30.0 - 36.0 g/dL Final  . RDW 08/06/2016 12.8  11.5 - 15.5 % Final  . Platelets 08/06/2016 216  150 - 400 K/uL Final  . MRSA, PCR 08/06/2016 NEGATIVE  NEGATIVE  Final  . Staphylococcus aureus 08/06/2016 NEGATIVE  NEGATIVE Final   Comment:        The Xpert SA Assay (FDA approved for NASAL specimens in patients over 77 years of age), is one component of a comprehensive surveillance program.  Test performance has been validated by [facility] for patients greater than or equal to 65 year old. It is not intended to diagnose infection nor to guide or monitor treatment.    No results for input(s): HGB in the last 72 hours. No results for input(s): WBC, RBC, HCT, PLT in the last 72 hours.  Recent Labs  08/16/16 0418  NA 139  K 4.0  CL 104  CO2 30  BUN 8  CREATININE 0.68  GLUCOSE 141*  CALCIUM 9.1   No results for input(s): LABPT, INR in the last 72 hours.  X-Rays:Dg Lumbar Spine 2-3 Views  Result Date: 08/06/2016 CLINICAL DATA:  Lumbago with left-sided radicular symptoms. Preoperative evaluation EXAM: LUMBAR SPINE - 2-3 VIEW COMPARISON:  None. FINDINGS: Upright frontal and upright lateral images obtained. There are 5 non-rib-bearing lumbar type vertebral bodies. There is mild lumbar dextroscoliosis. There is no fracture or spondylolisthesis. There is moderately severe disc space narrowing at  L4-5 and L5-S1. There is milder disc space narrowing at L2-3 and L3-4. No erosive change. IMPRESSION: Mild scoliosis. Multilevel arthropathic change, with disc space narrowing most marked at L4-5 and L5-S1. No fracture or spondylolisthesis. Electronically Signed   By: Lowella Grip III M.D.   On: 08/06/2016 10:03   Dg Spine Portable 1 View  Result Date: 08/15/2016 CLINICAL DATA:  L5-S1 lumbar decompression EXAM: PORTABLE SPINE - 1 VIEW COMPARISON:  Lateral view cervical spine prior film same day FINDINGS: Single lateral view of the lumbar spine submitted. There is a posterior localization instrument at the level of L5-S1 disc space. Again noted disc space flattening at L4-L5 and L5-S1 level. IMPRESSION: Posterior approach localization metallic  instrument at the level of L5-S1 disc space. Electronically Signed   By: Lahoma Crocker M.D.   On: 08/15/2016 13:02   Dg Spine Portable 1 View  Result Date: 08/15/2016 CLINICAL DATA:  L5-S1 left micro diskectomy EXAM: PORTABLE SPINE - 1 VIEW COMPARISON:  Portable cross-table lateral intraoperative lumbar spine radiograph from earlier today FINDINGS: Posterior approach surgical marking device terminates at the S1 level at the posterior margin of the sacral spinal canal. Multilevel thoracolumbar degenerative disc disease. IMPRESSION: Posterior approach surgical marking device position as described. Electronically Signed   By: Ilona Sorrel M.D.   On: 08/15/2016 12:37   Dg Spine Portable 1 View  Result Date: 08/15/2016 CLINICAL DATA:  Patient for L5-S1 microdiskectomy. Intraoperative localization film. EXAM: PORTABLE SPINE - 1 VIEW COMPARISON:  Two views lumbar spine 08/06/2016. FINDINGS: Single lateral view of the lumbar spine is provided. 2 probes are seen. The more superior is directed toward the L4-5 interspace and the more inferior is directed toward the L5-S1 interspace. IMPRESSION: Localization as above. Electronically Signed   By: Inge Rise M.D.   On: 08/15/2016 12:36    EKG:No orders found for this or any previous visit.   Hospital Course: Patient was admitted to [facility] and taken to the OR and underwent the above state procedure without complications.  Patient tolerated the procedure well and was later transferred to the recovery room and then to the orthopaedic floor for postoperative care.  They were given PO and IV analgesics for pain control following their surgery.  They were given 24 hours of postoperative antibiotics.   PT was consulted postop to assist with mobility and transfers.  The patient was allowed to be WBAT with therapy and was taught back precautions. Discharge planning was consulted to help with postop disposition and equipment needs.  Patient had a good night on  the evening of surgery and started to get up OOB with therapy on day one. Patient was seen in rounds and was ready to go home on day one.  They were given discharge instructions and dressing directions.  They were instructed on when to follow up in the office with Dr. Tonita Cong.   Diet: Regular diet Activity:WBAT: Lspine precautions Follow-up:in 10-14 days Disposition - Home Discharged Condition: good   Discharge Instructions    Call MD / Call 911    Complete by:  As directed    If you experience chest pain or shortness of breath, CALL 911 and be transported to the hospital emergency room.  If you develope a fever above 101 F, pus (white drainage) or increased drainage or redness at the wound, or calf pain, call your surgeon's office.   Constipation Prevention    Complete by:  As directed    Drink plenty of fluids.  Prune juice may be helpful.  You may use a stool softener, such as Colace (over the counter) 100 mg twice a day.  Use MiraLax (over the counter) for constipation as needed.   Diet - low sodium heart healthy    Complete by:  As directed    Increase activity slowly as tolerated    Complete by:  As directed      Allergies as of 08/16/2016      Reactions   Baclofen    Tongue swelling   Citalopram    Too sedated   Cymbalta [duloxetine Hcl]    Too sedated   Other    Migraine prevention medications Causes tingling in face and throat    Penicillins Swelling   Has patient had a PCN reaction causing immediate rash, facial/tongue/throat swelling, SOB or lightheadedness with hypotension: Yes Has patient had a PCN reaction causing severe rash involving mucus membranes or skin necrosis: No Has patient had a PCN reaction that required hospitalization No Has patient had a PCN reaction occurring within the last 10 years: Yes If all of the above answers are "NO", then may proceed with Cephalosporin use.   Septra [sulfamethoxazole-trimethoprim] Other (See Comments)   Feels like head is  going to explode   Zoloft [sertraline Hcl]    Too sedated   Zonisamide    Tongue swelling      Medication List    STOP taking these medications   ESTROVEN + ENERGY MAX STRENGTH PO   HYDROcodone-acetaminophen 5-325 MG tablet Commonly known as:  NORCO/VICODIN     TAKE these medications   ALPRAZolam 0.5 MG tablet Commonly known as:  XANAX Take 0.5 mg by mouth 3 (three) times daily as needed for anxiety (takes it once daily and later in day only if needed for anxiety).   benztropine 1 MG tablet Commonly known as:  COGENTIN Take 1 mg by mouth daily.   Calcium 500 MG Chew Chew 500 mg by mouth daily. On hold for procedure   diclofenac 75 MG EC tablet Commonly known as:  VOLTAREN Take 1 tablet (75 mg total) by mouth 2 (two) times daily. Resume 5 days post-op as needed What changed:  additional instructions   docusate sodium 100 MG capsule Commonly known as:  COLACE Take 1 capsule (100 mg total) by mouth 2 (two) times daily as needed for mild constipation.   gabapentin 800 MG tablet Commonly known as:  NEURONTIN Take 800 mg by mouth 3 (three) times daily.   haloperidol 0.5 MG tablet Commonly known as:  HALDOL Take 0.5 mg by mouth 3 (three) times daily.   methocarbamol 500 MG tablet Commonly known as:  ROBAXIN Take 1 tablet (500 mg total) by mouth every 6 (six) hours as needed for muscle spasms. What changed:  when to take this   oxyCODONE-acetaminophen 5-325 MG tablet Commonly known as:  PERCOCET Take 1-2 tablets by mouth every 4 (four) hours as needed for severe pain.   polyethylene glycol packet Commonly known as:  MIRALAX / GLYCOLAX Take 17 g by mouth daily.      Follow-up Information    BEANE,JEFFREY C, MD In 2 weeks.   Specialty:  Orthopedic Surgery Contact information: [address] [place] [address] [phone]           Signed: Lacie Draft, PA-C Orthopaedic Surgery 08/16/2016, 10:19 AM

## 2016-08-16 NOTE — Evaluation (Signed)
Physical Therapy Evaluation Patient Details Name: MALIAKA BRASINGTON MRN: [id number] DOB: 03-Oct-1955 Today's Date: 08/16/2016   History of Present Illness  61 year old female s/p L4-5, L5-S1 decompression  Clinical Impression  Education on back precautions completed, practiced steps. Ready for Dc.    Follow Up Recommendations No PT follow up    Equipment Recommendations  Rolling walker with 5" wheels    Recommendations for Other Services       Precautions / Restrictions Precautions Precautions: Fall;Back Restrictions Weight Bearing Restrictions: No      Mobility  Bed Mobility Overal bed mobility: Needs Assistance Bed Mobility: Sit to Supine       Sit to supine: Min assist   General bed mobility comments: assist with legs onto the bed., cues for back precautions  Transfers Overall transfer level: Needs assistance Equipment used: Rolling walker (2 wheeled) Transfers: Sit to/from Stand Sit to Stand: Supervision         General transfer comment: pt moves slowly.  cues for UE placement and back precautions  Ambulation/Gait Ambulation/Gait assistance: Supervision Ambulation Distance (Feet): 100 Feet Assistive device: Rolling walker (2 wheeled) Gait Pattern/deviations: Step-through pattern        Stairs Stairs: Yes Stairs assistance: Supervision Stair Management: One rail Left;Sideways Number of Stairs: 3 General stair comments: cues for stepping sideways  Wheelchair Mobility    Modified Rankin (Stroke Patients Only)       Balance                                             Pertinent Vitals/Pain Pain Assessment: Faces Pain Score: 5  Faces Pain Scale: Hurts little more Pain Location: back Pain Descriptors / Indicators: Grimacing;Discomfort Pain Intervention(s): Premedicated before session;Monitored during session    Home Living Family/patient expects to be discharged to:: Private residence Living Arrangements:  Spouse/significant other Available Help at Discharge: Family Type of Home: House Home Access: Stairs to enter Entrance Stairs-Rails: Psychiatric nurse of Steps: 4 Home Layout: Two level Home Equipment: Civil engineer, contracting Additional Comments: no back on seat--from mother    Prior Function Level of Independence: Independent               Hand Dominance        Extremity/Trunk Assessment   Upper Extremity Assessment Upper Extremity Assessment: Overall WFL for tasks assessed    Lower Extremity Assessment Lower Extremity Assessment: Overall WFL for tasks assessed    Cervical / Trunk Assessment Cervical / Trunk Assessment: Normal  Communication   Communication: No difficulties  Cognition Arousal/Alertness: Awake/alert Behavior During Therapy: WFL for tasks assessed/performed Overall Cognitive Status: Within Functional Limits for tasks assessed                      General Comments      Exercises     Assessment/Plan    PT Assessment Patent does not need any further PT services  PT Problem List         PT Treatment Interventions      PT Goals (Current goals can be found in the Care Plan section)  Acute Rehab PT Goals Patient Stated Goal: get back healed PT Goal Formulation: All assessment and education complete, DC therapy    Frequency     Barriers to discharge        Co-evaluation  End of Session   Activity Tolerance: Patient tolerated treatment well Patient left: in bed;with call bell/phone within reach;with family/visitor present Nurse Communication: Mobility status      Functional Assessment Tool Used: AM-PAC 6 Clicks Basic Mobility Functional Limitation: Mobility: Walking and moving around Mobility: Walking and Moving Around Current Status (O1751): At least 1 percent but less than 20 percent impaired, limited or restricted Mobility: Walking and Moving Around Goal Status [phone]): At least 1 percent but  less than 20 percent impaired, limited or restricted Mobility: Walking and Moving Around Discharge Status [phone]): At least 1 percent but less than 20 percent impaired, limited or restricted    Time: 4235-3614 PT Time Calculation (min) (ACUTE ONLY): 33 min   Charges:   PT Evaluation $PT Eval Low Complexity: 1 Procedure PT Treatments $Gait Training: 8-22 mins   PT G Codes:   PT G-Codes **NOT FOR INPATIENT CLASS** Functional Assessment Tool Used: AM-PAC 6 Clicks Basic Mobility Functional Limitation: Mobility: Walking and moving around Mobility: Walking and Moving Around Current Status (E3154): At least 1 percent but less than 20 percent impaired, limited or restricted Mobility: Walking and Moving Around Goal Status [phone]): At least 1 percent but less than 20 percent impaired, limited or restricted Mobility: Walking and Moving Around Discharge Status [phone]): At least 1 percent but less than 20 percent impaired, limited or restricted     Claretha Cooper 08/16/2016, 1:22 PM

## 2016-08-17 NOTE — Op Note (Signed)
Kristen Wilcox, Kristen Wilcox               ACCOUNT NO.:  [id number]  MEDICAL RECORD NO.:  [id number]  LOCATION:                                 FACILITY:  PHYSICIAN:  Susa Day, M.D.    DATE OF BIRTH:  05/23/1947  DATE OF PROCEDURE:  08/15/2016 DATE OF DISCHARGE:                              OPERATIVE REPORT   PREOPERATIVE DIAGNOSIS:  Spinal stenosis, HNP (Herniated nucleus pulposus) at L5-S1 left.  POSTOPERATIVE DIAGNOSIS:  Spinal stenosis, HNP (herniated nucleus pulposus) at L5-S1 left, spinal stenosis, L4-5.  PROCEDURE PERFORMED: 1. Microlumbar decompression at L5-S1, left. 2. Hemilaminectomy L5 left with decompression of L4-5 with     foraminotomies L5-S1. 3. Microdiskectomy, L5-S1.  ANESTHESIA:  General.  ASSISTANT:  Cleophas Dunker, PA.  HISTORY:  A 61 year old with severe left lower extremity radicular pain at L5-S1 nerve root distribution secondary to disk herniation, severe disc degeneration at L5-S1 with neuroforaminal stenosis.  He is indicated for microlumbar decompression at L5-S1 of the disc herniation migrated caudally.  Risks and benefits discussed including bleeding, infection, damage to neurovascular structures, no change in symptoms or worsening symptoms, DVT, PE, anesthetic complications, etc.  TECHNIQUE:  With the patient in supine position, after induction of adequate general anesthesia, 2 g Kefzol, placed prone on the [place] frame.  All bony prominences were well padded and lumbar region was prepped and draped in usual sterile fashion.  Two 18-gauge spinal needles were localized to localize the 5-1 interspace, confirmed with x- ray.  Incision was made above the spinous process L5-S1 subcutaneous tissue was dissected.  Electrocautery was utilized to achieve hemostasis.  Paraspinous muscle elevated from 5-1, 0.25% Marcaine with epinephrine was infiltrated in paraspinous musculature.  McCullough retractors were placed.  Operating microscope was draped  and brought on the surgical field.  The patient had a very small interlaminar window due to the disc degeneration at L4-L5 and L5-S1 and collapse of the inter laminar spaces.  I performed hemilaminotomy of the caudal edge of L5, cephalad edge of S1 with a 2 mm of Kerrison after detachment of the ligamentum flavum with a straight micro curette.  I performed a generous foraminotomy of S1.  The decompressed lateral recess to the medial border of the pedicle.  We continued with a removal of hemi lamina to gain access to the disk at L5-S1.  The hemi lamina was directly over the disk space.  I therefore removed the hemilamina of L5, also removed ligamentum of flavum from L4-L5 and filed nerve root from above the pedicle of 5 to foramen of 5 it had been compressed in the lateral recess and out of the foramina of 5 and we performed a generous foraminotomy of L5.  Then we obtained a confirmatory radiograph with a Penfield at the disc just above the S1 nerve root.  It was found to be erythematous and edematous, we gently mobilized it medially and retrieved 3 free fragments of the disk material from just beneath the disk space.  Then entered the disk space, irrigated then removed additional fragments irrigated with pulsatile lavage.  Distal fragments were removed.  Following this, there was 1 cm of excursion in the S1 nerve root and  pedicle.  A neural probe passed freely up the foramen of L5 and of S1.  We retracted the L5 nerve root to above the pedicle of L5 again having removed the hemi lamina due to the collapsed disk space. Next, we placed thrombin-soaked Gelfoam in the laminotomy defect.  I removed McCullough retractor, irrigated the paraspinous musculature.  We repaired the dorsolumbar fascia with 1 Vicryl, subcu with 2-0, skin with Prolene.  Sterile dressing applied, placed supine on the hospital bed, extubated without difficulty, and transported to the recovery in satisfactory  condition.  The patient tolerated the procedure well.  No complications.  Assistant, Cleophas Dunker, [place].  The disc material to Pathology for specimen.     Susa Day, M.D.   ______________________________ Susa Day, M.D.    Kristen Wilcox  D:  08/15/2016  T:  08/15/2016  Job:  [id number]

## 2016-12-21 NOTE — Addendum Note (Signed)
Addendum  created 12/21/16 0930 by Nolon Nations, MD   Sign clinical note

## 2016-12-21 NOTE — Anesthesia Postprocedure Evaluation (Signed)
Anesthesia Post Note  Patient: Kristen Wilcox  Procedure(s) Performed: Procedure(s) (LRB): Microlumbar decompression L4-L5, L5-S1 left (Left)     Anesthesia Post Evaluation  Last Vitals:  Vitals:   08/16/16 0534 08/16/16 1137  BP: 117/66 125/72  Pulse: 70 78  Resp: 17 17  Temp: 36.8 C 36.9 C    Last Pain:  Vitals:   08/16/16 1137  TempSrc: Oral  PainSc:                  Nolon Nations

## 2017-06-12 ENCOUNTER — Other Ambulatory Visit (HOSPITAL_COMMUNITY)
Admission: RE | Admit: 2017-06-12 | Discharge: 2017-06-12 | Disposition: A | Discharge status: Home / Self Care | Payer: BLUE CROSS/BLUE SHIELD | Source: Ambulatory Visit | Attending: Family Medicine | Admitting: Family Medicine

## 2017-06-12 ENCOUNTER — Other Ambulatory Visit: Payer: Self-pay | Admitting: Family Medicine

## 2017-06-12 DIAGNOSIS — Z124 Encounter for screening for malignant neoplasm of cervix: Secondary | ICD-10-CM | POA: Diagnosis not present

## 2017-06-12 DIAGNOSIS — R8290 Unspecified abnormal findings in urine: Secondary | ICD-10-CM | POA: Diagnosis not present

## 2017-06-12 DIAGNOSIS — K589 Irritable bowel syndrome without diarrhea: Secondary | ICD-10-CM | POA: Diagnosis not present

## 2017-06-12 DIAGNOSIS — Z0000 Encounter for general adult medical examination without abnormal findings: Secondary | ICD-10-CM | POA: Diagnosis not present

## 2017-06-12 DIAGNOSIS — R51 Headache: Secondary | ICD-10-CM | POA: Diagnosis not present

## 2017-06-12 DIAGNOSIS — E7800 Pure hypercholesterolemia, unspecified: Secondary | ICD-10-CM | POA: Diagnosis not present

## 2017-06-12 DIAGNOSIS — M791 Myalgia, unspecified site: Secondary | ICD-10-CM | POA: Diagnosis not present

## 2017-06-14 DIAGNOSIS — Z1211 Encounter for screening for malignant neoplasm of colon: Secondary | ICD-10-CM | POA: Diagnosis not present

## 2017-06-14 LAB — CYTOLOGY - PAP: DIAGNOSIS: NEGATIVE

## 2017-08-13 DIAGNOSIS — F330 Major depressive disorder, recurrent, mild: Secondary | ICD-10-CM | POA: Diagnosis not present

## 2017-08-21 DIAGNOSIS — M5136 Other intervertebral disc degeneration, lumbar region: Secondary | ICD-10-CM | POA: Diagnosis not present

## 2017-08-21 DIAGNOSIS — M545 Low back pain: Secondary | ICD-10-CM | POA: Diagnosis not present

## 2017-09-15 DIAGNOSIS — J019 Acute sinusitis, unspecified: Secondary | ICD-10-CM | POA: Diagnosis not present

## 2017-09-28 LAB — GLUCOSE, POCT (MANUAL RESULT ENTRY): POC Glucose: 134 mg/dl — AB (ref 70–99)

## 2017-10-30 DIAGNOSIS — M5136 Other intervertebral disc degeneration, lumbar region: Secondary | ICD-10-CM | POA: Diagnosis not present

## 2017-10-30 DIAGNOSIS — M545 Low back pain: Secondary | ICD-10-CM | POA: Diagnosis not present

## 2017-10-30 DIAGNOSIS — M5416 Radiculopathy, lumbar region: Secondary | ICD-10-CM | POA: Diagnosis not present

## 2017-12-10 DIAGNOSIS — M545 Low back pain: Secondary | ICD-10-CM | POA: Diagnosis not present

## 2017-12-25 ENCOUNTER — Other Ambulatory Visit: Payer: Self-pay | Admitting: Family Medicine

## 2017-12-25 DIAGNOSIS — Z1231 Encounter for screening mammogram for malignant neoplasm of breast: Secondary | ICD-10-CM

## 2018-01-20 DIAGNOSIS — K588 Other irritable bowel syndrome: Secondary | ICD-10-CM | POA: Diagnosis not present

## 2018-01-27 ENCOUNTER — Other Ambulatory Visit: Payer: Self-pay | Admitting: Physician Assistant

## 2018-01-27 ENCOUNTER — Ambulatory Visit
Admission: RE | Admit: 2018-01-27 | Discharge: 2018-01-27 | Disposition: A | Discharge status: Home / Self Care | Payer: BLUE CROSS/BLUE SHIELD | Source: Ambulatory Visit | Attending: Physician Assistant | Admitting: Physician Assistant

## 2018-01-27 DIAGNOSIS — K581 Irritable bowel syndrome with constipation: Secondary | ICD-10-CM

## 2018-01-27 DIAGNOSIS — R1030 Lower abdominal pain, unspecified: Secondary | ICD-10-CM

## 2018-01-27 DIAGNOSIS — K5900 Constipation, unspecified: Secondary | ICD-10-CM | POA: Diagnosis not present

## 2018-01-28 ENCOUNTER — Ambulatory Visit: Payer: BLUE CROSS/BLUE SHIELD

## 2018-02-05 ENCOUNTER — Ambulatory Visit: Payer: BLUE CROSS/BLUE SHIELD

## 2018-02-05 DIAGNOSIS — M545 Low back pain: Secondary | ICD-10-CM | POA: Diagnosis not present

## 2018-02-19 DIAGNOSIS — M545 Low back pain: Secondary | ICD-10-CM | POA: Diagnosis not present

## 2018-03-06 DIAGNOSIS — M545 Low back pain: Secondary | ICD-10-CM | POA: Diagnosis not present

## 2018-03-20 ENCOUNTER — Ambulatory Visit
Admission: RE | Admit: 2018-03-20 | Discharge: 2018-03-20 | Disposition: A | Discharge status: Home / Self Care | Payer: BLUE CROSS/BLUE SHIELD | Source: Ambulatory Visit | Attending: Family Medicine | Admitting: Family Medicine

## 2018-03-20 DIAGNOSIS — Z1231 Encounter for screening mammogram for malignant neoplasm of breast: Secondary | ICD-10-CM | POA: Diagnosis not present

## 2018-03-23 DIAGNOSIS — W57XXXA Bitten or stung by nonvenomous insect and other nonvenomous arthropods, initial encounter: Secondary | ICD-10-CM | POA: Diagnosis not present

## 2018-03-23 DIAGNOSIS — L299 Pruritus, unspecified: Secondary | ICD-10-CM | POA: Diagnosis not present

## 2018-04-02 DIAGNOSIS — M545 Low back pain: Secondary | ICD-10-CM | POA: Diagnosis not present

## 2018-05-21 DIAGNOSIS — Z23 Encounter for immunization: Secondary | ICD-10-CM | POA: Diagnosis not present

## 2018-05-28 DIAGNOSIS — J019 Acute sinusitis, unspecified: Secondary | ICD-10-CM | POA: Diagnosis not present

## 2018-05-28 DIAGNOSIS — G43009 Migraine without aura, not intractable, without status migrainosus: Secondary | ICD-10-CM | POA: Diagnosis not present

## 2018-05-28 DIAGNOSIS — H6500 Acute serous otitis media, unspecified ear: Secondary | ICD-10-CM | POA: Diagnosis not present

## 2018-07-01 DIAGNOSIS — Z1159 Encounter for screening for other viral diseases: Secondary | ICD-10-CM | POA: Diagnosis not present

## 2018-07-01 DIAGNOSIS — E7800 Pure hypercholesterolemia, unspecified: Secondary | ICD-10-CM | POA: Diagnosis not present

## 2018-07-01 DIAGNOSIS — Z23 Encounter for immunization: Secondary | ICD-10-CM | POA: Diagnosis not present

## 2018-07-01 DIAGNOSIS — Z0000 Encounter for general adult medical examination without abnormal findings: Secondary | ICD-10-CM | POA: Diagnosis not present

## 2018-07-09 DIAGNOSIS — F330 Major depressive disorder, recurrent, mild: Secondary | ICD-10-CM | POA: Diagnosis not present

## 2018-08-07 DIAGNOSIS — J069 Acute upper respiratory infection, unspecified: Secondary | ICD-10-CM | POA: Diagnosis not present

## 2018-08-13 DIAGNOSIS — K589 Irritable bowel syndrome without diarrhea: Secondary | ICD-10-CM | POA: Diagnosis not present

## 2018-08-13 DIAGNOSIS — R197 Diarrhea, unspecified: Secondary | ICD-10-CM | POA: Diagnosis not present

## 2018-08-20 DIAGNOSIS — R197 Diarrhea, unspecified: Secondary | ICD-10-CM | POA: Diagnosis not present

## 2018-09-25 DIAGNOSIS — K580 Irritable bowel syndrome with diarrhea: Secondary | ICD-10-CM | POA: Diagnosis not present

## 2018-09-25 DIAGNOSIS — R140 Abdominal distension (gaseous): Secondary | ICD-10-CM | POA: Diagnosis not present

## 2018-11-10 DIAGNOSIS — M545 Low back pain: Secondary | ICD-10-CM | POA: Diagnosis not present

## 2018-11-11 DIAGNOSIS — M545 Low back pain: Secondary | ICD-10-CM | POA: Diagnosis not present

## 2018-11-11 DIAGNOSIS — M519 Unspecified thoracic, thoracolumbar and lumbosacral intervertebral disc disorder: Secondary | ICD-10-CM | POA: Diagnosis not present

## 2018-11-11 DIAGNOSIS — M5136 Other intervertebral disc degeneration, lumbar region: Secondary | ICD-10-CM | POA: Diagnosis not present

## 2018-11-27 DIAGNOSIS — M5136 Other intervertebral disc degeneration, lumbar region: Secondary | ICD-10-CM | POA: Diagnosis not present

## 2018-12-25 DIAGNOSIS — R29898 Other symptoms and signs involving the musculoskeletal system: Secondary | ICD-10-CM | POA: Diagnosis not present

## 2019-01-06 DIAGNOSIS — F3341 Major depressive disorder, recurrent, in partial remission: Secondary | ICD-10-CM | POA: Diagnosis not present

## 2019-01-08 DIAGNOSIS — M5136 Other intervertebral disc degeneration, lumbar region: Secondary | ICD-10-CM | POA: Diagnosis not present

## 2019-01-08 DIAGNOSIS — M545 Low back pain: Secondary | ICD-10-CM | POA: Diagnosis not present

## 2019-02-09 DIAGNOSIS — M48061 Spinal stenosis, lumbar region without neurogenic claudication: Secondary | ICD-10-CM | POA: Diagnosis not present

## 2019-02-09 DIAGNOSIS — M545 Low back pain: Secondary | ICD-10-CM | POA: Diagnosis not present

## 2019-02-09 DIAGNOSIS — M546 Pain in thoracic spine: Secondary | ICD-10-CM | POA: Diagnosis not present

## 2019-02-09 DIAGNOSIS — M542 Cervicalgia: Secondary | ICD-10-CM | POA: Diagnosis not present

## 2019-02-10 DIAGNOSIS — M546 Pain in thoracic spine: Secondary | ICD-10-CM | POA: Diagnosis not present

## 2019-02-10 DIAGNOSIS — M542 Cervicalgia: Secondary | ICD-10-CM | POA: Diagnosis not present

## 2019-02-10 DIAGNOSIS — M545 Low back pain: Secondary | ICD-10-CM | POA: Diagnosis not present

## 2019-02-25 ENCOUNTER — Ambulatory Visit: Payer: BLUE CROSS/BLUE SHIELD | Admitting: Diagnostic Neuroimaging

## 2019-02-26 ENCOUNTER — Encounter

## 2019-02-26 ENCOUNTER — Other Ambulatory Visit: Payer: Self-pay

## 2019-02-26 ENCOUNTER — Encounter: Payer: Self-pay | Admitting: *Deleted

## 2019-02-26 ENCOUNTER — Encounter: Payer: Self-pay | Admitting: Neurology

## 2019-02-26 ENCOUNTER — Ambulatory Visit: Payer: BC Managed Care – PPO | Admitting: Neurology

## 2019-02-26 VITALS — BP 125/81 | HR 74 | Temp 98.4°F | Ht <= 58 in | Wt 131.0 lb

## 2019-02-26 DIAGNOSIS — R29898 Other symptoms and signs involving the musculoskeletal system: Secondary | ICD-10-CM | POA: Diagnosis not present

## 2019-02-26 DIAGNOSIS — M5441 Lumbago with sciatica, right side: Secondary | ICD-10-CM | POA: Diagnosis not present

## 2019-02-26 DIAGNOSIS — M5442 Lumbago with sciatica, left side: Secondary | ICD-10-CM | POA: Diagnosis not present

## 2019-02-26 DIAGNOSIS — G8929 Other chronic pain: Secondary | ICD-10-CM | POA: Diagnosis not present

## 2019-02-26 NOTE — Progress Notes (Addendum)
[facility]    Provider:  Dr Jaynee Eagles Requesting Provider: Neurosurgery Dr. Hyman Bower Primary Care Provider:  Alroy Dust, Carlean Jews.Marlou Sa, MD  CC:  Low back pain  HPI:  Kristen Wilcox is a 63 y.o. female here as requested by Dr. Tonita Cong  for evaluation of low back pain.  Patient is 2 years and 4 months out from lumbar decompression.  3 years ago she had left leg severe pain, radiculopathy, weakness, surgery was performed by Dr. Tonita Cong and it helped a lot. Within the last year she has been having issues with both legs, numb and weak, so weak she doesn't feel stable. She reports significant pain in the legs and low back pain. She has pain in the anterior and posterior of the legs, more aching and throbbing than radicular feels like legs are swelling up, no significant foot numbness, weakness is the major complaint.Symptoms are symmetric in the bilateral lower extremities. She does have residual left leg weakness from prior surgery. No foot drop. No falls. She is having difficulty walking steps. Arms not affected at all. No falls. Progressively worsening. No rashes or skin discoloration. She can only walk so far and then has to sit down and then can walk again, feel better after sitting down. She leans on the cart at the grocery store which helps.    Reviewed notes, labs and imaging from outside physicians, which showed:  I reviewed MRI of the lumbar spine report from [facility].  Significant for L4-L5 moderate central with severe bilateral lateral recess stenosis with bilateral L5 nerve root compression with mild bi-foraminal stenosis secondary to market disc space narrowing, mild disc bulge with broad central disc protrusion and moderate severe facet arthropathy with small bi-foraminal disc protrusions.  Ill-defined posterior surgical changes.  L5-S1 minimal canal stenosis with mild displacement of the left S1 nerve root within the lateral canal and lateral recess with market disc space  narrowing, mild disc bulge with left central to left foraminal small disc protrusion and minimal right foraminal disc protrusion with left laminar and facet surgery.  Mild/moderate by foraminal stenosis.  L3-L4 mild/moderate central and mild bi-foraminal stenosis secondary to mild disc bulge with by foraminal extension and moderate severe facet arthropathy, ill-defined posterior surgical changes.  Will request CD with images from [facility]   BMP 2018 with elevated glucose 141 otherwise nml  I reviewed Dr. Clayton Bibles notes.  Patient's visits for back pain, she is a little over 2 years out from lumbar decompression, leg pain bilateral, foot pain bilateral, pain level 7 out of 10, constant, stabbing, sitting laying down or aggravating factors, patient feels weak when she is walking, and the front to the back of the legs, epidural did help her, physical exam showed normal cervical lordosis, no pain with range of motion, no palpable tenderness, 5 out of 5 in all groups in the upper extremities, upper extremity sensory exam normal, normoreflexic in the uppers, no Hoffmann sign, straight leg raise is negative lower extremities, she does get good dorsiflexion and plantarflexion, good extensor hallucis longus strength, abductor strength quadriceps strength abductor 5 out of 5.  1+ DTRs in the knees and Achilles.  No Babinski or clonus.  No Hoffmann sign.  She is able to bend forward.  She is able to ambulate with a slight antalgic gait.  No DVT.  Feet are warm.  Review of Systems: Patient complains of symptoms per HPI as well as the following symptoms: back pain. Pertinent negatives and positives per HPI.  All others negative.   Social History   Socioeconomic History  . Marital status: Married    Spouse name: Not on file  . Number of children: Not on file  . Years of education: Not on file  . Highest education level: Not on file  Occupational History  . Not on file  Social Needs  . Financial  resource strain: Not on file  . Food insecurity    Worry: Not on file    Inability: Not on file  . Transportation needs    Medical: Not on file    Non-medical: Not on file  Tobacco Use  . Smoking status: Former Smoker    Types: Cigarettes  . Smokeless tobacco: Never Used  Substance and Sexual Activity  . Alcohol use: Yes    Comment: occasional  . Drug use: No  . Sexual activity: Not on file  Lifestyle  . Physical activity    Days per week: Not on file    Minutes per session: Not on file  . Stress: Not on file  Relationships  . Social Herbalist on phone: Not on file    Gets together: Not on file    Attends religious service: Not on file    Active member of club or organization: Not on file    Attends meetings of clubs or organizations: Not on file    Relationship status: Not on file  . Intimate partner violence    Fear of current or ex partner: Not on file    Emotionally abused: Not on file    Physically abused: Not on file    Forced sexual activity: Not on file  Other Topics Concern  . Not on file  Social History Narrative   Lives at home with husband   Right handed    Family History  Problem Relation Age of Onset  . Hypertension Mother   . Dementia Mother   . Hypertension Father   . Other Father        hx of carcinoma  . Bladder Cancer Father     Past Medical History:  Diagnosis Date  . Anxiety   . Cancer (Many)    skin cancer  . Depression   . Lumbar herniated disc     l5 to s1    Patient Active Problem List   Diagnosis Date Noted  . HNP (herniated nucleus pulposus), lumbar 08/15/2016    Past Surgical History:  Procedure Laterality Date  . c section     x2  . COLONOSCOPY     x2  . LUMBAR LAMINECTOMY/DECOMPRESSION MICRODISCECTOMY Left 08/15/2016   Procedure: Microlumbar decompression L4-L5, L5-S1 left;  Surgeon: Susa Day, MD;  Location: [facility];  Service: Orthopedics;  Laterality: Left;  90 mins  . TUBAL LIGATION      Current  Outpatient Medications  Medication Sig Dispense Refill  . ALPRAZolam (XANAX) 0.5 MG tablet Take 0.5 mg by mouth 2 (two) times daily as needed for anxiety.     . benztropine (COGENTIN) 1 MG tablet Take 1 mg by mouth daily.    Marland Kitchen CALCIUM PO Take by mouth.    . diclofenac (VOLTAREN) 75 MG EC tablet Take 1 tablet (75 mg total) by mouth 2 (two) times daily. Resume 5 days post-op as needed (Patient taking differently: Take 75 mg by mouth 2 (two) times daily as needed (pain). )    . gabapentin (NEURONTIN) 800 MG tablet Take 800 mg by mouth 3 (three) times daily.    Marland Kitchen  HYDROcodone-acetaminophen (NORCO/VICODIN) 5-325 MG tablet Take 1 tablet by mouth every 6 (six) hours as needed (pain).    . hyoscyamine (OSCIMIN) 0.125 MG tablet Take 0.125 mg by mouth every 4 (four) hours as needed (abdominal pain).    . methocarbamol (ROBAXIN) 500 MG tablet Take 1 tablet (500 mg total) by mouth every 6 (six) hours as needed for muscle spasms. (Patient taking differently: Take 250 mg by mouth 4 (four) times daily as needed. ) 40 tablet 1  . OVER THE COUNTER MEDICATION Estrogens    . Probiotic Product (ALIGN PO) Take by mouth.    . risperiDONE (RISPERDAL) 0.5 MG tablet Take 0.5 mg by mouth 3 (three) times daily.     . ondansetron (ZOFRAN) 4 MG tablet Take 4 mg by mouth every 6 (six) hours as needed for nausea.     No current facility-administered medications for this visit.     Allergies as of 02/26/2019 - Review Complete 02/26/2019  Allergen Reaction Noted  . Baclofen  08/06/2016  . Citalopram  08/06/2016  . Cymbalta [duloxetine hcl]  08/06/2016  . Other  08/02/2016  . Penicillins Swelling 07/31/2016  . Septra [sulfamethoxazole-trimethoprim] Other (See Comments) 07/31/2016  . Zoloft [sertraline hcl]  08/06/2016  . Zonisamide  08/06/2016    Vitals: BP 125/81 (BP Location: Left Arm, Patient Position: Sitting)   Pulse 74   Temp 98.4 F (36.9 C) Comment: taken by checkin staff  Ht 4' 9.5" (1.461 m)   Wt 131 lb  (59.4 kg)   BMI 27.86 kg/m  Last Weight:  Wt Readings from Last 1 Encounters:  02/26/19 131 lb (59.4 kg)   Last Height:   Ht Readings from Last 1 Encounters:  02/26/19 4' 9.5" (1.461 m)     Physical exam: Exam: Gen: NAD, conversant, well nourised, well groomed                     CV: RRR, no MRG. No Carotid Bruits. No peripheral edema, warm, nontender Eyes: Conjunctivae clear without exudates or hemorrhage  Neuro: Detailed Neurologic Exam  Speech:    Speech is normal; fluent and spontaneous with normal comprehension.  Cognition:    The patient is oriented to person, place, and time;     recent and remote memory intact;     language fluent;     normal attention, concentration,     fund of knowledge Cranial Nerves:    The pupils are equal, round, and reactive to light.  Attempted funduscopic exam could not visualize due to small pupils.  Visual fields are full to finger confrontation. Extraocular movements are intact. Trigeminal sensation is intact and the muscles of mastication are normal. The face is symmetric. The palate elevates in the midline. Hearing intact. Voice is normal. Shoulder shrug is normal. The tongue has normal motion without fasciculations.   Coordination:    Normal finger to nose and heel to shin.   Gait: She can get upwithout using her arms easily,  Heel-toe and tandem gait are normal minimal imbalance, slightly antalgic  Motor Observation:    No asymmetry, no atrophy, and no involuntary movements noted. Tone:    Normal muscle tone.    Posture:    Posture is normal. normal erect    Strength: very poor effort on exam, giveway throughout, on testing for example hip flexion is 2+/5 but she can stand out of chair without using hands which is not consistent with this level of weakness. Symmetrical however, no focal deficits.  She does have good strength on heel walking, dorsiflexion, extensor hallucis longus 5 out of 5.  No focal deficits suspect normal  strength.    Sensation: intact to LT. Negative romberg.  Patchy sensory loss in no dermatomal pattern seen.     Reflex Exam: left AJ absent, right AJ trace, patellars 1+, biceps normal 2+.   DTR's:    Deep tendon reflexes in the upper and lower extremities are normal bilaterally.   Toes:    The toes are downgoing bilaterally.   Clonus:    Clonus is absent.    Assessment/Plan: 63 year old that status post lumbar decompression a little over 2 years ago here for low back pain from [employer].  Patient reports worsening bilateral leg pain, numbness, weakness symmetrically.  On examination she has very poor effort on strength exam and giveaway throughout making it difficult to assess however no focal weakness and strength, patchy sensory loss and no dermatomal pattern or pattern seen consistent with a distal polyneuropathy, however MRI of the lumbar spine does show significant degenerative changes consistent with disc degeneration L4-L5 L5-S1 and nerve root compression with severe lateral recess stenosis L4-L5, moderate central with severe bilateral lateral recess stenosis with bilateral L5 nerve root compression and possible left s1 displacement in the lateral recess. Dr. Tonita Cong would like an emg/ncs and I agree, will schedule. Not myopathic on examination.    Orders Placed This Encounter  Procedures  . NCV with EMG(electromyography)    Cc: Alroy Dust, L.Marlou Sa, MD,  Dr. Rolene Arbour, MD  Sarina Ill, MD  [facility] [address] [place], [place] [address]  Phone [phone] Fax [phone]

## 2019-02-26 NOTE — Patient Instructions (Signed)
Electromyoneurogram Electromyoneurogram is a test to check how well your muscles and nerves are working. This procedure includes the combined use of electromyogram (EMG) and nerve conduction study (NCS). EMG is used to look for muscular disorders. NCS, which is also called electroneurogram, measures how well your nerves are controlling your muscles. The procedures are usually done together to check if your muscles and nerves are healthy. If the results of the tests are abnormal, this may indicate disease or injury, such as a neuromuscular disease or peripheral nerve damage. Tell a health care provider about:  Any allergies you have.  All medicines you are taking, including vitamins, herbs, eye drops, creams, and over-the-counter medicines.  Any problems you or family members have had with anesthetic medicines.  Any blood disorders you have.  Any surgeries you have had.  Any medical conditions you have.  If you have a pacemaker.  Whether you are pregnant or may be pregnant. What are the risks? Generally, this is a safe procedure. However, problems may occur, including:  Infection where the electrodes were inserted.  Bleeding. What happens before the procedure? Medicines Ask your health care provider about:  Changing or stopping your regular medicines. This is especially important if you are taking diabetes medicines or blood thinners.  Taking medicines such as aspirin and ibuprofen. These medicines can thin your blood. Do not take these medicines unless your health care provider tells you to take them.  Taking over-the-counter medicines, vitamins, herbs, and supplements. General instructions  Your health care provider may ask you to avoid: ? Beverages that have caffeine, such as coffee and tea. ? Any products that contain nicotine or tobacco. These products include cigarettes, e-cigarettes, and chewing tobacco. If you need help quitting, ask your health care provider.  Do not  use lotions or creams on the same day that you will be having the procedure. What happens during the procedure? For EMG   Your health care provider will ask you to stay in a position so that he or she can access the muscle that will be studied. You may be standing, sitting, or lying down.  You may be given a medicine that numbs the area (local anesthetic).  A very thin needle that has an electrode will be inserted into your muscle.  Another small electrode will be placed on your skin near the muscle.  Your health care provider will ask you to continue to remain still.  The electrodes will send a signal that tells about the electrical activity of your muscles. You may see this on a monitor or hear it in the room.  After your muscles have been studied at rest, your health care provider will ask you to contract or flex your muscles. The electrodes will send a signal that tells about the electrical activity of your muscles.  Your health care provider will remove the electrodes and the electrode needles when the procedure is finished. The procedure may vary among health care providers and hospitals. For NCS   An electrode that records your nerve activity (recording electrode) will be placed on your skin by the muscle that is being studied.  An electrode that is used as a reference (reference electrode) will be placed near the recording electrode.  A paste or gel will be applied to your skin between the recording electrode and the reference electrode.  Your nerve will be stimulated with a mild shock. Your health care provider will measure how much time it takes for your muscle to react.    Your health care provider will remove the electrodes and the gel when the procedure is finished. The procedure may vary among health care providers and hospitals. What happens after the procedure?  It is up to you to get the results of your procedure. Ask your health care provider, or the department  that is doing the procedure, when your results will be ready.  Your health care provider may: ? Give you medicines for any pain. ? Monitor the insertion sites to make sure that bleeding stops. Summary  Electromyoneurogram is a test to check how well your muscles and nerves are working.  If the results of the tests are abnormal, this may indicate disease or injury.  This is a safe procedure. However, problems may occur, such as bleeding and infection.  Your health care provider will do two tests to complete this procedure. One checks your muscles (EMG) and another checks your nerves (NCS).  It is up to you to get the results of your procedure. Ask your health care provider, or the department that is doing the procedure, when your results will be ready. This information is not intended to replace advice given to you by your health care provider. Make sure you discuss any questions you have with your health care provider. Document Released: 09/28/2004 Document Revised: 02/11/2018 Document Reviewed: 01/24/2018 Elsevier Patient Education  2020 [employer]  

## 2019-03-01 ENCOUNTER — Encounter: Payer: Self-pay | Admitting: Neurology

## 2019-03-10 ENCOUNTER — Ambulatory Visit: Payer: BLUE CROSS/BLUE SHIELD | Admitting: Diagnostic Neuroimaging

## 2019-03-13 ENCOUNTER — Telehealth: Payer: Self-pay | Admitting: Neurology

## 2019-03-13 NOTE — Telephone Encounter (Signed)
Kristen Wilcox, I think this should have gone to the work-in doctor this morning? Let me know what you are supposed to do with messages like this, sounds like she needed someone to call her pretty quickly. thanks

## 2019-03-13 NOTE — Telephone Encounter (Signed)
Pt states she is having major pain in her legs and back. So much so that her YDROcodone-acetaminophen (NORCO/VICODIN) 5-325 MG tablet is not working.  Pt is asking if Dr Jaynee Eagles will call in something stronger for her until the 22nd.  Pt was asked if she reached out to who prescribed her the YDROcodone-acetaminophen (NORCO/VICODIN) 5-325 MG tablet she said no because they are not likely to give her anything else.  Pt states it is difficult to get out of bed.  Please call

## 2019-03-13 NOTE — Telephone Encounter (Signed)
I called her home number and it just rang no voicemail. I called her cell phone and it went to voicemail but voicemail was full. I have only seen patient recently once. I do not prescribe opioids especially pain medications stronger that vicodin, and she should not be getting high-risk medication like this from more than one doctor. Also cannot check the [place] database at this time. She should call the physician who prescribed those meds to her or Dr. Tonita Cong or unfortunately go to the emergency room.   Dr. Felecia Shelling - in case she pages you this is my advice. Thanks.

## 2019-04-02 ENCOUNTER — Other Ambulatory Visit: Payer: Self-pay

## 2019-04-02 ENCOUNTER — Ambulatory Visit (INDEPENDENT_AMBULATORY_CARE_PROVIDER_SITE_OTHER): Payer: BC Managed Care – PPO | Admitting: Neurology

## 2019-04-02 ENCOUNTER — Ambulatory Visit: Payer: BC Managed Care – PPO | Admitting: Neurology

## 2019-04-02 DIAGNOSIS — R29898 Other symptoms and signs involving the musculoskeletal system: Secondary | ICD-10-CM | POA: Diagnosis not present

## 2019-04-02 DIAGNOSIS — Z0289 Encounter for other administrative examinations: Secondary | ICD-10-CM

## 2019-04-02 NOTE — Progress Notes (Signed)
Full Name: Kristen Wilcox Gender: Female MRN #: [id number] Date of Birth: [birth year]    Visit Date: 04/02/2019 09:23 Age: 63 Years 59 Months Old Examining Physician: Sarina Ill, MD  Requesting Provider: Neurosurgery Dr. Hyman Bower Primary Care Provider:  Alroy Dust, Carlean Jews.Marlou Sa, MD    History: 63 year old that status post lumbar decompression a little over 2 years ago here for low back pain from [employer].  Patient reports worsening bilateral leg pain, numbness, weakness symmetrically.  On examination she has very poor effort on strength exam and giveaway throughout making it difficult to assess however no focal weakness and strength, patchy sensory loss and no dermatomal pattern or pattern seen consistent with a distal polyneuropathy, however MRI of the lumbar spine does show significant degenerative changes consistent with disc degeneration L4-L5 L5-S1 and nerve root compression with severe lateral recess stenosis L4-L5, moderate central with severe bilateral lateral recess stenosis with bilateral L5 nerve root compression and possible left s1 displacement in the lateral recess.   Summary: EMG/NCS was performed on the lower extremities. Cannot perform emg/ncs on the lumbar paraspinals as these are unreliable after surgery. All nerves and muscles (as indicated in the following tables) were within normal limits.     Conclusion: This is a normal study. No electrophysiologic evidence for mononeuropathy, polyneuropathy, myopathy/myositis or other muscle disorders to account for patient's leg symptoms. There was no suggestion of radiculopathy on this test however some radiculopathies cannot be confirmed by needle EMG, even though the signs and symptoms along with imaging results suggest that radiculopathy is the correct diagnosis.  Sarina Ill, M.D.  [facility] [place], [place] [address] Tel: [phone] Fax: [phone]   cc: Requesting Provider:  Neurosurgery Dr. Hyman Bower; Primary Care Provider:  Alroy Dust, Carlean Jews.Marlou Sa, MD     [facility]    Nerve / Sites Muscle Latency Ref. Amplitude Ref. Rel Amp Segments Distance Velocity Ref. Area    ms ms mV mV %  cm m/s m/s mVms  R Peroneal - EDB     Ankle EDB 5.8 ?6.5 2.9 ?2.0 100 Ankle - EDB 9   11.6     Fib head EDB 9.9  2.5  87.3 Fib head - Ankle 21 51 ?44 10.7     Pop fossa EDB 11.9  1.7  66.7 Pop fossa - Fib head 10 51 ?44 6.3         Pop fossa - Ankle      L Peroneal - EDB     Ankle EDB 4.3 ?6.5 2.8 ?2.0 100 Ankle - EDB 9   11.1     Fib head EDB 8.6  2.7  97.1 Fib head - Ankle 22 51 ?44 11.5     Pop fossa EDB 10.9  2.4  87.5 Pop fossa - Fib head 10 44 ?44 11.0         Pop fossa - Ankle      R Tibial - AH     Ankle AH 4.1 ?5.8 16.4 ?4.0 100 Ankle - AH 9   36.9     Pop fossa AH 10.4  13.4  81.6 Pop fossa - Ankle 29 46 ?41 33.8  L Tibial - AH     Ankle AH 4.3 ?5.8 20.3 ?4.0 100 Ankle - AH 9   41.5     Pop fossa AH 10.5  15.9  78.3 Pop fossa - Ankle 29 47 ?41 35.0  [place]    Nerve / Sites Rec. Site Peak Lat Ref.  Amp Ref. Segments Distance    ms ms V V  cm  R Sural - Ankle (Calf)     Calf Ankle 3.2 ?4.4 20 ?6 Calf - Ankle 14  L Sural - Ankle (Calf)     Calf Ankle 3.0 ?4.4 24 ?6 Calf - Ankle 14  R Superficial peroneal - Ankle     Lat leg Ankle 3.4 ?4.4 17 ?6 Lat leg - Ankle 14  L Superficial peroneal - Ankle     Lat leg Ankle 3.6 ?4.4 12 ?6 Lat leg - Ankle 14              F  Wave    Nerve F Lat Ref.   ms ms  R Tibial - AH 41.0 ?56.0  L Tibial - AH 44.0 ?56.0         EMG full       EMG Summary Table    Spontaneous MUAP Recruitment  Muscle IA Fib PSW Fasc Other Amp Dur. Poly Pattern  L. Iliopsoas Normal None None None _______ Normal Normal Normal Normal  R. Iliopsoas Normal None None None _______ Normal Normal Normal Normal  L. Vastus medialis Normal None None None _______ Normal Normal Normal Normal  R. Vastus medialis Normal None None None _______ Normal Normal Normal  Normal  L. Tibialis anterior Normal None None None _______ Normal Normal Normal Normal  R. Tibialis anterior Normal None None None _______ Normal Normal Normal Normal  L. Gastrocnemius (Medial head) Normal None None None _______ Normal Normal Normal Normal  R. Gastrocnemius (Medial head) Normal None None None _______ Normal Normal Normal Normal  L. Biceps femoris (long head) Normal None None None _______ Normal Normal Normal Normal  R. Biceps femoris (long head) Normal None None None _______ Normal Normal Normal Normal  L. Gluteus maximus Normal None None None _______ Normal Normal Normal Normal  R. Gluteus maximus Normal None None None _______ Normal Normal Normal Normal  L. Gluteus medius Normal None None None _______ Normal Normal Normal Normal  R. Gluteus medius Normal None None None _______ Normal Normal Normal Normal

## 2019-04-02 NOTE — Progress Notes (Signed)
See procedure note.

## 2019-04-06 NOTE — Procedures (Signed)
Full Name: Katryna Fetzer Gender: Female MRN #: [id number] Date of Birth: 02-Feb-1956    Visit Date: 04/02/2019 09:23 Age: 63 Years 13 Months Old Examining Physician: Sarina Ill, MD  Requesting Provider: Neurosurgery Dr. Hyman Bower Primary Care Provider:  Alroy Dust, Carlean Jews.Marlou Sa, MD    History: 63 year old that status post lumbar decompression a little over 2 years ago here for low back pain from [employer].  Patient reports worsening bilateral leg pain, numbness, weakness symmetrically.  On examination she has very poor effort on strength exam and giveaway throughout making it difficult to assess however no focal weakness and strength, patchy sensory loss and no dermatomal pattern or pattern seen consistent with a distal polyneuropathy, however MRI of the lumbar spine does show significant degenerative changes consistent with disc degeneration L4-L5 L5-S1 and nerve root compression with severe lateral recess stenosis L4-L5, moderate central with severe bilateral lateral recess stenosis with bilateral L5 nerve root compression and possible left s1 displacement in the lateral recess.   Summary: EMG/NCS was performed on the lower extremities. Cannot perform emg/ncs on the lumbar paraspinals as these are unreliable after surgery. All nerves and muscles (as indicated in the following tables) were within normal limits.     Conclusion: This is a normal study. No electrophysiologic evidence for mononeuropathy, polyneuropathy, myopathy/myositis or other muscle disorders to account for patient's leg symptoms. There was no suggestion of radiculopathy on this test however some radiculopathies cannot be confirmed by needle EMG, even though the signs and symptoms along with imaging results suggest that radiculopathy is the correct diagnosis.  Sarina Ill, M.D.  [facility] [place], [place] [address] Tel: [phone] Fax: [phone]   cc: Requesting Provider:  Neurosurgery Dr. Hyman Bower; Primary Care Provider:  Alroy Dust, Carlean Jews.Marlou Sa, MD     [facility]    Nerve / Sites Muscle Latency Ref. Amplitude Ref. Rel Amp Segments Distance Velocity Ref. Area    ms ms mV mV %  cm m/s m/s mVms  R Peroneal - EDB     Ankle EDB 5.8 ?6.5 2.9 ?2.0 100 Ankle - EDB 9   11.6     Fib head EDB 9.9  2.5  87.3 Fib head - Ankle 21 51 ?44 10.7     Pop fossa EDB 11.9  1.7  66.7 Pop fossa - Fib head 10 51 ?44 6.3         Pop fossa - Ankle      L Peroneal - EDB     Ankle EDB 4.3 ?6.5 2.8 ?2.0 100 Ankle - EDB 9   11.1     Fib head EDB 8.6  2.7  97.1 Fib head - Ankle 22 51 ?44 11.5     Pop fossa EDB 10.9  2.4  87.5 Pop fossa - Fib head 10 44 ?44 11.0         Pop fossa - Ankle      R Tibial - AH     Ankle AH 4.1 ?5.8 16.4 ?4.0 100 Ankle - AH 9   36.9     Pop fossa AH 10.4  13.4  81.6 Pop fossa - Ankle 29 46 ?41 33.8  L Tibial - AH     Ankle AH 4.3 ?5.8 20.3 ?4.0 100 Ankle - AH 9   41.5     Pop fossa AH 10.5  15.9  78.3 Pop fossa - Ankle 29 47 ?41 35.0  [place]    Nerve / Sites Rec. Site Peak Lat Ref.  Amp Ref. Segments Distance    ms ms V V  cm  R Sural - Ankle (Calf)     Calf Ankle 3.2 ?4.4 20 ?6 Calf - Ankle 14  L Sural - Ankle (Calf)     Calf Ankle 3.0 ?4.4 24 ?6 Calf - Ankle 14  R Superficial peroneal - Ankle     Lat leg Ankle 3.4 ?4.4 17 ?6 Lat leg - Ankle 14  L Superficial peroneal - Ankle     Lat leg Ankle 3.6 ?4.4 12 ?6 Lat leg - Ankle 14              F  Wave    Nerve F Lat Ref.   ms ms  R Tibial - AH 41.0 ?56.0  L Tibial - AH 44.0 ?56.0         EMG full       EMG Summary Table    Spontaneous MUAP Recruitment  Muscle IA Fib PSW Fasc Other Amp Dur. Poly Pattern  L. Iliopsoas Normal None None None _______ Normal Normal Normal Normal  R. Iliopsoas Normal None None None _______ Normal Normal Normal Normal  L. Vastus medialis Normal None None None _______ Normal Normal Normal Normal  R. Vastus medialis Normal None None None _______ Normal Normal Normal  Normal  L. Tibialis anterior Normal None None None _______ Normal Normal Normal Normal  R. Tibialis anterior Normal None None None _______ Normal Normal Normal Normal  L. Gastrocnemius (Medial head) Normal None None None _______ Normal Normal Normal Normal  R. Gastrocnemius (Medial head) Normal None None None _______ Normal Normal Normal Normal  L. Biceps femoris (long head) Normal None None None _______ Normal Normal Normal Normal  R. Biceps femoris (long head) Normal None None None _______ Normal Normal Normal Normal  L. Gluteus maximus Normal None None None _______ Normal Normal Normal Normal  R. Gluteus maximus Normal None None None _______ Normal Normal Normal Normal  L. Gluteus medius Normal None None None _______ Normal Normal Normal Normal  R. Gluteus medius Normal None None None _______ Normal Normal Normal Normal

## 2019-04-07 DIAGNOSIS — M545 Low back pain: Secondary | ICD-10-CM | POA: Diagnosis not present

## 2019-04-07 DIAGNOSIS — M542 Cervicalgia: Secondary | ICD-10-CM | POA: Diagnosis not present

## 2019-04-23 DIAGNOSIS — F3341 Major depressive disorder, recurrent, in partial remission: Secondary | ICD-10-CM | POA: Diagnosis not present

## 2019-04-30 DIAGNOSIS — Z79891 Long term (current) use of opiate analgesic: Secondary | ICD-10-CM | POA: Diagnosis not present

## 2019-04-30 DIAGNOSIS — M961 Postlaminectomy syndrome, not elsewhere classified: Secondary | ICD-10-CM | POA: Diagnosis not present

## 2019-04-30 DIAGNOSIS — M545 Low back pain: Secondary | ICD-10-CM | POA: Diagnosis not present

## 2019-07-03 DIAGNOSIS — Z0000 Encounter for general adult medical examination without abnormal findings: Secondary | ICD-10-CM | POA: Diagnosis not present

## 2019-08-12 DIAGNOSIS — F330 Major depressive disorder, recurrent, mild: Secondary | ICD-10-CM | POA: Diagnosis not present

## 2019-08-12 DIAGNOSIS — F3341 Major depressive disorder, recurrent, in partial remission: Secondary | ICD-10-CM | POA: Diagnosis not present

## 2019-09-02 DIAGNOSIS — F330 Major depressive disorder, recurrent, mild: Secondary | ICD-10-CM | POA: Diagnosis not present

## 2019-09-30 DIAGNOSIS — F330 Major depressive disorder, recurrent, mild: Secondary | ICD-10-CM | POA: Diagnosis not present

## 2019-10-07 DIAGNOSIS — Z79891 Long term (current) use of opiate analgesic: Secondary | ICD-10-CM | POA: Diagnosis not present

## 2019-10-14 DIAGNOSIS — F330 Major depressive disorder, recurrent, mild: Secondary | ICD-10-CM | POA: Diagnosis not present

## 2019-11-09 IMAGING — CR DG ABDOMEN 2V
2 series · 2 of 2 positions shown · non-contrast
Comparison: None

CLINICAL DATA: Lower abdominal pain, intermittent sharp cramping
aching pain with constant nausea, history of irritable bowel
syndrome

EXAM:
ABDOMEN - 2 VIEW

[w abdomen upright]
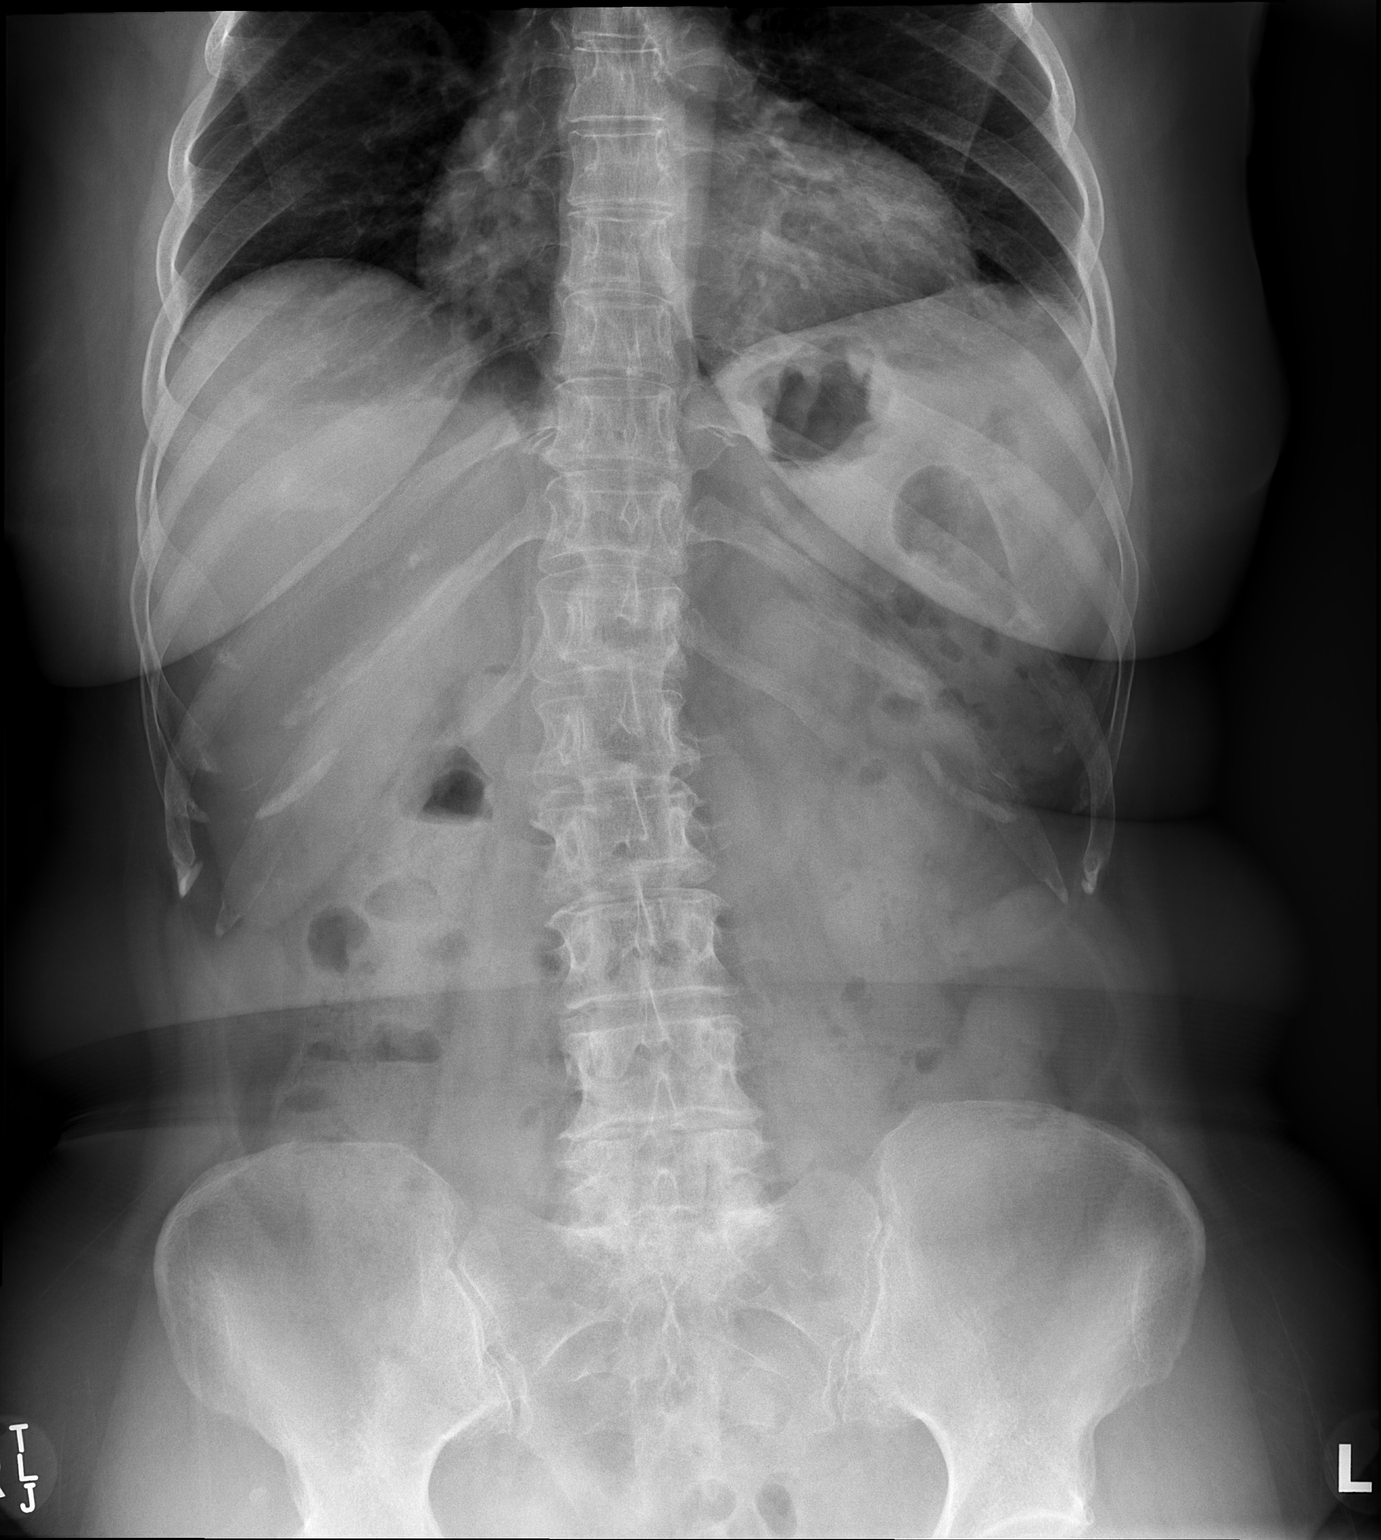

[t abdomen supine]
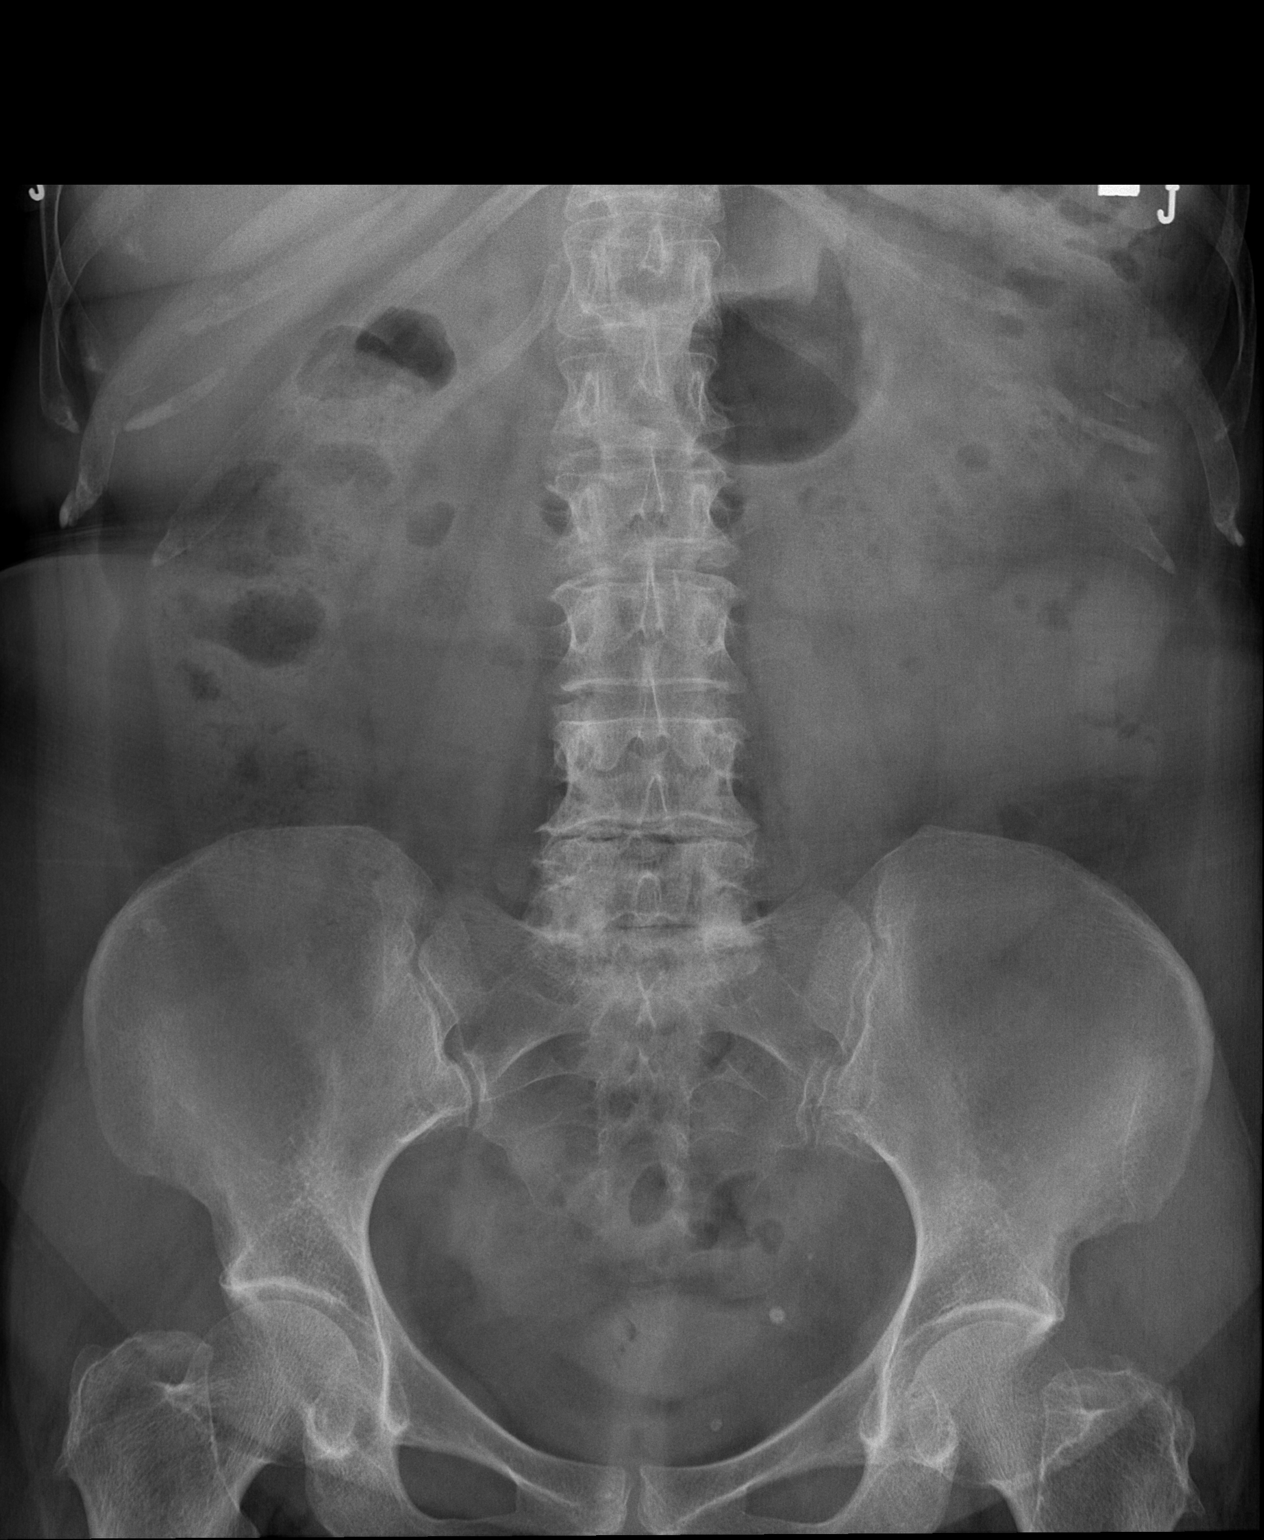

[2 of 2 positions shown; findings below may reference images not displayed]

FINDINGS: Lung bases clear.

Nonobstructive bowel gas pattern.

No bowel dilatation, bowel wall thickening or free air.

LEFT pelvic calcifications likely phleboliths.

No definite urinary tract calcification.

Bones demineralized with degenerative disc disease changes of the
lumbar spine greatest at L4-L5.
IMPRESSION: Normal bowel gas pattern.

## 2019-11-11 DIAGNOSIS — F330 Major depressive disorder, recurrent, mild: Secondary | ICD-10-CM | POA: Diagnosis not present

## 2020-01-25 DIAGNOSIS — F3341 Major depressive disorder, recurrent, in partial remission: Secondary | ICD-10-CM | POA: Diagnosis not present

## 2020-01-26 DIAGNOSIS — R05 Cough: Secondary | ICD-10-CM | POA: Diagnosis not present

## 2020-01-26 DIAGNOSIS — Z20822 Contact with and (suspected) exposure to covid-19: Secondary | ICD-10-CM | POA: Diagnosis not present

## 2020-02-12 DIAGNOSIS — M5416 Radiculopathy, lumbar region: Secondary | ICD-10-CM | POA: Diagnosis not present

## 2020-08-02 DIAGNOSIS — M5459 Other low back pain: Secondary | ICD-10-CM | POA: Diagnosis not present

## 2020-08-02 DIAGNOSIS — M961 Postlaminectomy syndrome, not elsewhere classified: Secondary | ICD-10-CM | POA: Diagnosis not present

## 2020-08-19 DIAGNOSIS — M545 Low back pain, unspecified: Secondary | ICD-10-CM | POA: Diagnosis not present

## 2020-08-29 DIAGNOSIS — M961 Postlaminectomy syndrome, not elsewhere classified: Secondary | ICD-10-CM | POA: Diagnosis not present

## 2020-09-01 DIAGNOSIS — M961 Postlaminectomy syndrome, not elsewhere classified: Secondary | ICD-10-CM | POA: Diagnosis not present

## 2020-09-01 DIAGNOSIS — Z79891 Long term (current) use of opiate analgesic: Secondary | ICD-10-CM | POA: Diagnosis not present

## 2020-09-01 DIAGNOSIS — Z79899 Other long term (current) drug therapy: Secondary | ICD-10-CM | POA: Diagnosis not present

## 2020-09-01 DIAGNOSIS — M5416 Radiculopathy, lumbar region: Secondary | ICD-10-CM | POA: Diagnosis not present

## 2020-09-01 DIAGNOSIS — Z5181 Encounter for therapeutic drug level monitoring: Secondary | ICD-10-CM | POA: Diagnosis not present

## 2020-12-19 DIAGNOSIS — G894 Chronic pain syndrome: Secondary | ICD-10-CM | POA: Diagnosis not present

## 2021-01-20 DIAGNOSIS — B370 Candidal stomatitis: Secondary | ICD-10-CM | POA: Diagnosis not present

## 2021-01-20 DIAGNOSIS — J029 Acute pharyngitis, unspecified: Secondary | ICD-10-CM | POA: Diagnosis not present

## 2021-01-28 DIAGNOSIS — H9209 Otalgia, unspecified ear: Secondary | ICD-10-CM | POA: Diagnosis not present

## 2021-01-28 DIAGNOSIS — K140 Glossitis: Secondary | ICD-10-CM | POA: Diagnosis not present

## 2021-01-28 DIAGNOSIS — R519 Headache, unspecified: Secondary | ICD-10-CM | POA: Diagnosis not present

## 2021-02-07 ENCOUNTER — Encounter (HOSPITAL_COMMUNITY): Payer: Self-pay

## 2021-02-07 ENCOUNTER — Other Ambulatory Visit: Payer: Self-pay

## 2021-02-07 ENCOUNTER — Emergency Department (HOSPITAL_COMMUNITY)
Admission: EM | Admit: 2021-02-07 | Discharge: 2021-02-07 | Disposition: A | Discharge status: Home / Self Care | Payer: Medicare Other | Attending: Student | Admitting: Student

## 2021-02-07 DIAGNOSIS — Z85828 Personal history of other malignant neoplasm of skin: Secondary | ICD-10-CM | POA: Insufficient documentation

## 2021-02-07 DIAGNOSIS — R070 Pain in throat: Secondary | ICD-10-CM | POA: Diagnosis not present

## 2021-02-07 DIAGNOSIS — J029 Acute pharyngitis, unspecified: Secondary | ICD-10-CM | POA: Diagnosis not present

## 2021-02-07 DIAGNOSIS — K146 Glossodynia: Secondary | ICD-10-CM | POA: Diagnosis not present

## 2021-02-07 DIAGNOSIS — Z743 Need for continuous supervision: Secondary | ICD-10-CM | POA: Diagnosis not present

## 2021-02-07 DIAGNOSIS — Z20822 Contact with and (suspected) exposure to covid-19: Secondary | ICD-10-CM | POA: Diagnosis not present

## 2021-02-07 DIAGNOSIS — Z87891 Personal history of nicotine dependence: Secondary | ICD-10-CM | POA: Insufficient documentation

## 2021-02-07 DIAGNOSIS — H9209 Otalgia, unspecified ear: Secondary | ICD-10-CM | POA: Diagnosis not present

## 2021-02-07 LAB — RESP PANEL BY RT-PCR (FLU A&B, COVID) ARPGX2
Influenza A by PCR: NEGATIVE
Influenza B by PCR: NEGATIVE
SARS Coronavirus 2 by RT PCR: NEGATIVE

## 2021-02-07 LAB — MONONUCLEOSIS SCREEN: Mono Screen: NEGATIVE

## 2021-02-07 LAB — GROUP A STREP BY PCR: Group A Strep by PCR: NOT DETECTED

## 2021-02-07 MED ORDER — METHYLPREDNISOLONE 4 MG PO TBPK: ORAL_TABLET | ORAL | 0 refills | Status: DC

## 2021-02-07 MED ORDER — MENTHOL 3 MG MT LOZG
1.0000 | LOZENGE | Freq: Once | OROMUCOSAL | Status: DC
  Filled 2021-02-07: qty 9

## 2021-02-07 MED ORDER — NAPROXEN 375 MG PO TABS: 375.0000 mg | ORAL_TABLET | Freq: Two times a day (BID) | ORAL | 0 refills | Status: AC | PRN

## 2021-02-07 NOTE — ED Provider Notes (Signed)
[facility] DEPT Provider Note   CSN: [id number] Arrival date & time: 02/07/21  1511     History Chief Complaint  Patient presents with   Sore Throat    Kristen Wilcox is a 65 y.o. female.  Patient with past medical history of chronic pain syndrome presents today with chief complaint of sore throat x 3 weeks. Patient has been seen 3 times prior at urgent care for same. Was originally diagnosed with thrush and given magic mouthwash without success, returned 1 week later and given doxycycline, returned to urgent care today with same symptoms, was discharged and refused to leave until EMS was called to transfer to ED for second opinion. Patient states the pain is burning at the back of her throat. No at home remedies have been tried at this time.     The history is provided by the patient. No language interpreter was used.  Sore Throat The current episode started more than 1 week ago. The problem has not changed since onset.Pertinent negatives include no chest pain, no abdominal pain, no headaches and no shortness of breath.      Past Medical History:  Diagnosis Date   Anxiety    Cancer ([place])    skin cancer   Depression    Lumbar herniated disc     l5 to s1    Patient Active Problem List   Diagnosis Date Noted   HNP (herniated nucleus pulposus), lumbar 08/15/2016    Past Surgical History:  Procedure Laterality Date   c section     x2   COLONOSCOPY     x2   LUMBAR LAMINECTOMY/DECOMPRESSION MICRODISCECTOMY Left 08/15/2016   Procedure: Microlumbar decompression L4-L5, L5-S1 left;  Surgeon: Susa Day, MD;  Location: [facility];  Service: Orthopedics;  Laterality: Left;  90 mins   TUBAL LIGATION       OB History   No obstetric history on file.     Family History  Problem Relation Age of Onset   Hypertension Mother    Dementia Mother    Hypertension Father    Other Father        hx of carcinoma   Bladder Cancer Father     Social  History   Tobacco Use   Smoking status: Former    Types: Cigarettes   Smokeless tobacco: Never  Vaping Use   Vaping Use: Never used  Substance Use Topics   Alcohol use: Yes    Comment: occasional   Drug use: No    Home Medications Prior to Admission medications   Medication Sig Start Date End Date Taking? Authorizing Provider  ALPRAZolam Duanne Moron) 0.5 MG tablet Take 0.5 mg by mouth 2 (two) times daily as needed for anxiety.     [provider]  benztropine (COGENTIN) 1 MG tablet Take 1 mg by mouth daily.    [provider]  CALCIUM PO Take by mouth.    [provider]  diclofenac (VOLTAREN) 75 MG EC tablet Take 1 tablet (75 mg total) by mouth 2 (two) times daily. Resume 5 days post-op as needed Patient taking differently: Take 75 mg by mouth 2 (two) times daily as needed (pain).  08/16/16   Cecilie Kicks, PA-C  gabapentin (NEURONTIN) 800 MG tablet Take 800 mg by mouth 3 (three) times daily.    [provider]  HYDROcodone-acetaminophen (NORCO/VICODIN) 5-325 MG tablet Take 1 tablet by mouth every 6 (six) hours as needed (pain).    [provider]  hyoscyamine (OSCIMIN) 0.125 MG tablet Take 0.125 mg by mouth every 4 (four) hours as needed (abdominal pain).    [provider]  methocarbamol (ROBAXIN) 500 MG tablet Take 1 tablet (500 mg total) by mouth every 6 (six) hours as needed for muscle spasms. Patient taking differently: Take 250 mg by mouth 4 (four) times daily as needed.  08/15/16   Susa Day, MD  ondansetron (ZOFRAN) 4 MG tablet Take 4 mg by mouth every 6 (six) hours as needed for nausea.    [provider]  OVER THE COUNTER MEDICATION Estrogens    [provider]  Probiotic Product (ALIGN PO) Take by mouth.    [provider]  risperiDONE (RISPERDAL) 0.5 MG tablet Take 0.5 mg by mouth 3 (three) times daily.     [provider]    Allergies    Baclofen, Citalopram, Cymbalta [duloxetine  hcl], Other, Penicillins, Septra [sulfamethoxazole-trimethoprim], Zoloft [sertraline hcl], and Zonisamide  Review of Systems   Review of Systems  Constitutional:  Negative for chills and fever.  HENT:  Positive for postnasal drip, rhinorrhea and sore throat. Negative for congestion, facial swelling, mouth sores, nosebleeds, sinus pressure, sinus pain, sneezing, trouble swallowing and voice change.   Respiratory:  Negative for apnea, cough, choking, chest tightness, shortness of breath, wheezing and stridor.   Cardiovascular:  Negative for chest pain.  Gastrointestinal:  Negative for abdominal pain, nausea and vomiting.  Neurological:  Negative for dizziness, weakness, numbness and headaches.  Psychiatric/Behavioral:  Negative for behavioral problems.    Physical Exam Updated Vital Signs BP (!) 149/86 (BP Location: Left Arm)   Pulse 77   Temp 98 F (36.7 C) (Oral)   Resp 16   Ht '4\' 9"'$  (1.448 m)   Wt 52.2 kg   SpO2 98%   BMI 24.89 kg/m   Physical Exam Constitutional:      General: She is not in acute distress.    Appearance: She is well-developed. She is not ill-appearing, toxic-appearing or diaphoretic.  HENT:     Mouth/Throat:     Mouth: Mucous membranes are moist. No oral lesions.     Pharynx: Oropharynx is clear. Uvula midline. Posterior oropharyngeal erythema present. No pharyngeal swelling, oropharyngeal exudate or uvula swelling.     Tonsils: No tonsillar exudate or tonsillar abscesses. 1+ on the right. 1+ on the left.  Eyes:     Conjunctiva/sclera: Conjunctivae normal.  Neck:     Thyroid: No thyromegaly.  Cardiovascular:     Rate and Rhythm: Normal rate and regular rhythm.     Heart sounds: Normal heart sounds.  Pulmonary:     Effort: Pulmonary effort is normal.     Breath sounds: Normal breath sounds.  Abdominal:     Palpations: Abdomen is soft.  Musculoskeletal:     Cervical back: Normal range of motion and neck supple.  Lymphadenopathy:     Head:     Right  side of head: Submandibular and tonsillar adenopathy present. No submental, preauricular, posterior auricular or occipital adenopathy.     Left side of head: Submandibular and tonsillar adenopathy present. No submental, preauricular, posterior auricular or occipital adenopathy.     Cervical: No cervical adenopathy.     Right cervical: No superficial, deep or posterior cervical adenopathy.    Left cervical: No superficial, deep or posterior cervical adenopathy.     Upper Body:     Right upper body: No supraclavicular adenopathy.     Left upper body: No supraclavicular adenopathy.  Comments: Tonsillar and submandibular lymph nodes swollen and tender to palpation bilaterally.  Skin:    General: Skin is warm and dry.  Neurological:     General: No focal deficit present.     Mental Status: She is alert and oriented to person, place, and time.    ED Results / Procedures / Treatments   Labs (all labs ordered are listed, but only abnormal results are displayed) Labs Reviewed  RESP PANEL BY RT-PCR (FLU A&B, COVID) [id number]  GROUP A STREP BY PCR  MONONUCLEOSIS SCREEN    EKG None  Radiology No results found.  Procedures Procedures   Medications Ordered in ED Medications - No data to display  ED Course  I have reviewed the triage vital signs and the nursing notes.  Pertinent labs & imaging results that were available during my care of the patient were reviewed by me and considered in my medical decision making (see chart for details).    MDM Rules/Calculators/A&P                         Patient presents with 3 weeks of sore throat. Failed treatment at urgent care with magic mouthwash and doxycycline. Patient with negative strep, COVID, and mono spot. Uvula is midline, no trismus, patient denies difficulty swallowing. No concern for PTA or RPA. Airway is patent.  Pt afebrile without tonsillar exudate, negative strep, mono, and COVID. Presents with mild tonsillar lymphadenopathy,  suspect viral nature, no abx indicated. Discharged with symptomatic tx for pain and inflammation.  Pt does not appear dehydrated, but did discuss importance of water rehydration. Specific return precautions discussed. Pt able to drink water in ED without difficulty with intact air way. Referred for ENT follow-up.    Final Clinical Impression(s) / ED Diagnoses Final diagnoses:  Sore throat    Rx / DC Orders ED Discharge Orders          Ordered    methylPREDNISolone (MEDROL DOSEPAK) 4 MG TBPK tablet        02/07/21 2022    naproxen (NAPROSYN) 375 MG tablet  2 times daily PRN        02/07/21 2022          An After Visit Summary was printed and given to the patient.    Nestor Lewandowsky 02/07/21 2146    Teressa Lower, MD 02/08/21 [phone]

## 2021-02-07 NOTE — ED Triage Notes (Signed)
Pt arrives via [facility] c/o sore throat x3 weeks. Pt has been seen at [facility] multiple times per EMS for same. Pt was picked up by EMS from [facility] per patient request for second opinion.

## 2021-02-07 NOTE — Discharge Instructions (Addendum)
Take naproxen and methylprednisolone as prescribed for sore throat and associated inflammation. Can add over the counter Cepacol if needed. Follow-up with ENT.

## 2021-02-16 DIAGNOSIS — R682 Dry mouth, unspecified: Secondary | ICD-10-CM | POA: Diagnosis not present

## 2021-02-16 DIAGNOSIS — R198 Other specified symptoms and signs involving the digestive system and abdomen: Secondary | ICD-10-CM | POA: Diagnosis not present

## 2021-02-16 DIAGNOSIS — J3489 Other specified disorders of nose and nasal sinuses: Secondary | ICD-10-CM | POA: Diagnosis not present

## 2021-06-15 DIAGNOSIS — G894 Chronic pain syndrome: Secondary | ICD-10-CM | POA: Diagnosis not present

## 2021-06-22 DIAGNOSIS — M8588 Other specified disorders of bone density and structure, other site: Secondary | ICD-10-CM | POA: Diagnosis not present

## 2021-06-22 DIAGNOSIS — Z0000 Encounter for general adult medical examination without abnormal findings: Secondary | ICD-10-CM | POA: Diagnosis not present

## 2021-06-22 DIAGNOSIS — R519 Headache, unspecified: Secondary | ICD-10-CM | POA: Diagnosis not present

## 2021-06-22 DIAGNOSIS — E7800 Pure hypercholesterolemia, unspecified: Secondary | ICD-10-CM | POA: Diagnosis not present

## 2021-06-22 DIAGNOSIS — K589 Irritable bowel syndrome without diarrhea: Secondary | ICD-10-CM | POA: Diagnosis not present

## 2021-06-22 DIAGNOSIS — Z1211 Encounter for screening for malignant neoplasm of colon: Secondary | ICD-10-CM | POA: Diagnosis not present

## 2021-06-22 DIAGNOSIS — Z13820 Encounter for screening for osteoporosis: Secondary | ICD-10-CM | POA: Diagnosis not present

## 2021-06-22 DIAGNOSIS — R0789 Other chest pain: Secondary | ICD-10-CM | POA: Diagnosis not present

## 2021-06-22 DIAGNOSIS — Z23 Encounter for immunization: Secondary | ICD-10-CM | POA: Diagnosis not present

## 2021-07-03 ENCOUNTER — Other Ambulatory Visit: Payer: Self-pay

## 2021-07-03 ENCOUNTER — Other Ambulatory Visit: Payer: Self-pay | Admitting: Internal Medicine

## 2021-07-03 DIAGNOSIS — Z1231 Encounter for screening mammogram for malignant neoplasm of breast: Secondary | ICD-10-CM

## 2021-07-04 ENCOUNTER — Other Ambulatory Visit: Payer: Self-pay | Admitting: Internal Medicine

## 2021-07-04 DIAGNOSIS — M8580 Other specified disorders of bone density and structure, unspecified site: Secondary | ICD-10-CM

## 2021-10-19 ENCOUNTER — Ambulatory Visit: Payer: Medicare Other

## 2021-10-24 ENCOUNTER — Ambulatory Visit
Admission: RE | Admit: 2021-10-24 | Discharge: 2021-10-24 | Disposition: A | Discharge status: Home / Self Care | Payer: Medicare Other | Source: Ambulatory Visit | Attending: Internal Medicine | Admitting: Internal Medicine

## 2021-10-24 DIAGNOSIS — Z1231 Encounter for screening mammogram for malignant neoplasm of breast: Secondary | ICD-10-CM | POA: Diagnosis not present

## 2021-11-10 DIAGNOSIS — Z79891 Long term (current) use of opiate analgesic: Secondary | ICD-10-CM | POA: Diagnosis not present

## 2021-11-10 DIAGNOSIS — M5416 Radiculopathy, lumbar region: Secondary | ICD-10-CM | POA: Diagnosis not present

## 2021-11-10 DIAGNOSIS — G894 Chronic pain syndrome: Secondary | ICD-10-CM | POA: Diagnosis not present

## 2021-11-10 DIAGNOSIS — M961 Postlaminectomy syndrome, not elsewhere classified: Secondary | ICD-10-CM | POA: Diagnosis not present

## 2021-12-06 DIAGNOSIS — M5416 Radiculopathy, lumbar region: Secondary | ICD-10-CM | POA: Diagnosis not present

## 2021-12-06 DIAGNOSIS — M545 Low back pain, unspecified: Secondary | ICD-10-CM | POA: Diagnosis not present

## 2021-12-06 DIAGNOSIS — Z79891 Long term (current) use of opiate analgesic: Secondary | ICD-10-CM | POA: Diagnosis not present

## 2021-12-06 DIAGNOSIS — Z79899 Other long term (current) drug therapy: Secondary | ICD-10-CM | POA: Diagnosis not present

## 2021-12-06 DIAGNOSIS — M961 Postlaminectomy syndrome, not elsewhere classified: Secondary | ICD-10-CM | POA: Diagnosis not present

## 2021-12-06 DIAGNOSIS — G894 Chronic pain syndrome: Secondary | ICD-10-CM | POA: Diagnosis not present

## 2021-12-06 DIAGNOSIS — Z5181 Encounter for therapeutic drug level monitoring: Secondary | ICD-10-CM | POA: Diagnosis not present

## 2021-12-08 ENCOUNTER — Ambulatory Visit (HOSPITAL_BASED_OUTPATIENT_CLINIC_OR_DEPARTMENT_OTHER): Payer: Medicare Other | Admitting: Internal Medicine

## 2021-12-21 ENCOUNTER — Other Ambulatory Visit: Payer: Medicare Other

## 2021-12-30 DIAGNOSIS — G894 Chronic pain syndrome: Secondary | ICD-10-CM | POA: Diagnosis not present

## 2021-12-30 DIAGNOSIS — M5416 Radiculopathy, lumbar region: Secondary | ICD-10-CM | POA: Diagnosis not present

## 2021-12-30 DIAGNOSIS — M961 Postlaminectomy syndrome, not elsewhere classified: Secondary | ICD-10-CM | POA: Diagnosis not present

## 2021-12-30 DIAGNOSIS — Z79891 Long term (current) use of opiate analgesic: Secondary | ICD-10-CM | POA: Diagnosis not present

## 2022-02-22 DIAGNOSIS — Z5181 Encounter for therapeutic drug level monitoring: Secondary | ICD-10-CM | POA: Diagnosis not present

## 2022-02-22 DIAGNOSIS — Z79891 Long term (current) use of opiate analgesic: Secondary | ICD-10-CM | POA: Diagnosis not present

## 2022-02-22 DIAGNOSIS — M5416 Radiculopathy, lumbar region: Secondary | ICD-10-CM | POA: Diagnosis not present

## 2022-02-22 DIAGNOSIS — Z79899 Other long term (current) drug therapy: Secondary | ICD-10-CM | POA: Diagnosis not present

## 2022-02-22 DIAGNOSIS — G894 Chronic pain syndrome: Secondary | ICD-10-CM | POA: Diagnosis not present

## 2022-04-23 DIAGNOSIS — K529 Noninfective gastroenteritis and colitis, unspecified: Secondary | ICD-10-CM | POA: Diagnosis not present

## 2022-04-24 ENCOUNTER — Ambulatory Visit: Payer: Medicare Other | Attending: Internal Medicine | Admitting: Internal Medicine

## 2022-04-24 ENCOUNTER — Telehealth: Payer: Self-pay | Admitting: Internal Medicine

## 2022-04-24 ENCOUNTER — Encounter: Payer: Self-pay | Admitting: Internal Medicine

## 2022-04-24 VITALS — BP 108/73 | HR 64 | Ht <= 58 in | Wt 133.6 lb

## 2022-04-24 DIAGNOSIS — E7801 Familial hypercholesterolemia: Secondary | ICD-10-CM

## 2022-04-24 DIAGNOSIS — M791 Myalgia, unspecified site: Secondary | ICD-10-CM

## 2022-04-24 DIAGNOSIS — R072 Precordial pain: Secondary | ICD-10-CM

## 2022-04-24 DIAGNOSIS — Z8249 Family history of ischemic heart disease and other diseases of the circulatory system: Secondary | ICD-10-CM

## 2022-04-24 DIAGNOSIS — T466X5A Adverse effect of antihyperlipidemic and antiarteriosclerotic drugs, initial encounter: Secondary | ICD-10-CM | POA: Diagnosis not present

## 2022-04-24 MED ORDER — METOPROLOL TARTRATE 50 MG PO TABS: 50.0000 mg | ORAL_TABLET | ORAL | 0 refills | Status: DC

## 2022-04-24 MED ORDER — NEXLIZET 180-10 MG PO TABS: 1.0000 | ORAL_TABLET | Freq: Every day | ORAL | 11 refills | Status: AC

## 2022-04-24 NOTE — Telephone Encounter (Signed)
This PA has been approved. Please let patient know. Thanks

## 2022-04-24 NOTE — Patient Instructions (Addendum)
Medication Instructions:  Dr. Debara Pickett has prescribed NEXLIZET 180-'10mg'$  daily We will work on getting this approved with insurance.    The [facility] offers assistance to help pay for medication copays.  They will cover copays for all cholesterol lowering meds, including statins, fibrates, omega-3 fish oils like Vascepa, ezetimibe, Repatha, Praluent, Nexletol, Nexlizet.  The cards are usually good for $2,500 or 12 months, whichever comes first. Go to healthwellfoundation.org Click on "Apply Now" Answer questions as to whom is applying (patient or representative) Your disease fund will be "hypercholesterolemia - Medicare access" They will ask questions about finances and which medications you are taking for cholesterol When you submit, the approval is usually within minutes.  You will need to print the card information from the site You will need to show this information to your pharmacy, they will bill your Medicare Part D plan first -then bill Health Well --for the copay.   You can also call them at [phone], although the hold times can be quite long.     *If you need a refill on your cardiac medications before your next appointment, please call your pharmacy*   Lab Work: Non-Fasting BMET -- prior to CT test  Fasting NMR lipoprofile and LPa in 3-4 months  If you have labs (blood work) drawn today and your tests are completely normal, you will receive your results only by: [place] (if you have MyChart) OR A paper copy in the mail If you have any lab test that is abnormal or we need to change your treatment, we will call you to review the results.   Testing/Procedures: Coronary CT at [facility]  Once approved with insurance, you will get a call to schedule the test.    Follow-Up: At [facility], you and your health needs are our priority.  As part of our continuing mission to provide you with exceptional heart care, we have created designated  Provider Care Teams.  These Care Teams include your primary Cardiologist (physician) and Advanced Practice Providers (APPs -  Physician Assistants and Nurse Practitioners) who all work together to provide you with the care you need, when you need it.  We recommend signing up for the patient portal called "MyChart".  Sign up information is provided on this After Visit Summary.  MyChart is used to connect with patients for Virtual Visits (Telemedicine).  Patients are able to view lab/test results, encounter notes, upcoming appointments, etc.  Non-urgent messages can be sent to your provider as well.   To learn more about what you can do with MyChart, go to [email or web address].    Your next appointment:    3-4 months with Dr. Debara Pickett  Other Instructions   Your cardiac CT will be scheduled at one of the below locations:   [facility] [address] [place], [place] [address] [phone]  [place] [address] [place], [place] [address] [phone]  [facility] [place], [place] [address] [phone]  If scheduled at [facility], please arrive at the [facility] and Children's Entrance (Entrance C2) of [facility] 30 minutes prior to test start time. You can use the FREE valet parking offered at entrance C (encouraged to control the heart rate for the test)  Proceed to the [facility] Radiology Department (first floor) to check-in and test prep.  All radiology patients and guests should use entrance C2 at [facility], accessed from [facility], even though the hospital's physical address listed is [address].    If scheduled at [facility] or [facility], please arrive 15 mins early for check-in and test prep.   Please follow these instructions carefully (unless otherwise  directed):  Hold all erectile dysfunction medications at least 3 days (72 hrs) prior to test. (Ie viagra, cialis, sildenafil, tadalafil, etc) We will administer nitroglycerin during this exam.   On the Night Before the Test: Be sure to Drink plenty of water. Do not consume any caffeinated/decaffeinated beverages or chocolate 12 hours prior to your test. Do not take any antihistamines 12 hours prior to your test.  On the Day of the Test: Drink plenty of water until 1 hour prior to the test. Do not eat any food 1 hour prior to test. You may take your regular medications prior to the test.  Take metoprolol (Lopressor) two hours prior to test. FEMALES- please wear underwire-free bra if available, avoid dresses & tight clothing  After the Test: Drink plenty of water. After receiving IV contrast, you may experience a mild flushed feeling. This is normal. On occasion, you may experience a mild rash up to 24 hours after the test. This is not dangerous. If this occurs, you can take Benadryl 25 mg and increase your fluid intake. If you experience trouble breathing, this can be serious. If it is severe call 911 IMMEDIATELY. If it is mild, please call our office.  We will call to schedule your test 2-4 weeks out understanding that some insurance companies will need an authorization prior to the service being performed.   For non-scheduling related questions, please contact the cardiac imaging nurse navigator should you have any questions/concerns: Marchia Bond, Cardiac Imaging Nurse Navigator Gordy Clement, Cardiac Imaging Nurse Navigator [facility] Direct Office Dial: [phone]   For scheduling needs, including cancellations and rescheduling, please call [place], [phone].

## 2022-04-24 NOTE — Telephone Encounter (Signed)
Returned call to patient and made her aware that PA was approved. Advised patient to call back to office with any issues, questions, or concerns. Patient verbalized understanding.

## 2022-04-24 NOTE — Progress Notes (Signed)
LIPID CLINIC CONSULT NOTE  Chief Complaint:  Chest pain  Primary Care Physician: Mckinley Jewel, MD  Primary Cardiologist:  None  HPI:  Kristen Wilcox is a 66 y.o. female who is being seen today for the evaluation of chest pain at the request of Pahwani, Michell Heinrich, MD. This is a pleasant 66 year old female currently referred for evaluation management of high cholesterol.  Back in January of this year her lipids were significantly elevated.  Total cholesterol 301, HDL 55, triglycerides 181 and LDL 211.  This is reportedly a longstanding issue.  She had been tried on numerous statin medications in the past all of which she was intolerant of.  She has a new primary care provider who had referred her for cardiovascular evaluation.  She is also been having some chest discomfort.  She says it is worse when she lays on her stomach at night which is how she has to sleep.  She says it is a burning type chest pain does seem to improve with either heating pad or an acids.  Of note though, she has a strong family history of high cholesterol in her mother who also had coronary artery disease in her mid 70s including stents and multivessel bypass and is subsequently deceased.  An EKG was performed today shows normal sinus rhythm at 64.  PMHx:  Past Medical History:  Diagnosis Date   Anxiety    Cancer ([place])    skin cancer   Depression    Lumbar herniated disc     l5 to s1    Past Surgical History:  Procedure Laterality Date   c section     x2   COLONOSCOPY     x2   LUMBAR LAMINECTOMY/DECOMPRESSION MICRODISCECTOMY Left 08/15/2016   Procedure: Microlumbar decompression L4-L5, L5-S1 left;  Surgeon: Susa Day, MD;  Location: [facility];  Service: Orthopedics;  Laterality: Left;  90 mins   TUBAL LIGATION      FAMHx:  Family History  Problem Relation Age of Onset   Hypertension Mother    Dementia Mother    Hypertension Father    Other Father        hx of carcinoma   Bladder Cancer Father      SOCHx:   reports that she has quit smoking. Her smoking use included cigarettes. She has never used smokeless tobacco. She reports current alcohol use. She reports that she does not use drugs.  ALLERGIES:  Allergies  Allergen Reactions   Baclofen     Tongue swelling   Citalopram     Too sedated   Cymbalta [Duloxetine Hcl]     Too sedated   Other     Migraine prevention medications Causes tingling in face and throat    Penicillins Swelling    Has patient had a PCN reaction causing immediate rash, facial/tongue/throat swelling, SOB or lightheadedness with hypotension: Yes Has patient had a PCN reaction causing severe rash involving mucus membranes or skin necrosis: No Has patient had a PCN reaction that required hospitalization No Has patient had a PCN reaction occurring within the last 10 years: Yes If all of the above answers are "NO", then may proceed with Cephalosporin use.    Septra [Sulfamethoxazole-Trimethoprim] Other (See Comments)    Feels like head is going to explode confusion    Zoloft [Sertraline Hcl]     Too sedated   Zonisamide     Tongue swelling    ROS: Pertinent items noted in HPI and remainder  of comprehensive ROS otherwise negative.  HOME MEDS: Current Outpatient Medications on File Prior to Visit  Medication Sig Dispense Refill   ALPRAZolam (XANAX) 0.5 MG tablet Take 0.5 mg by mouth 2 (two) times daily as needed for anxiety.      benztropine (COGENTIN) 1 MG tablet Take 1 mg by mouth daily. Patient is taking 2 times daily     CALCIUM PO Take by mouth.     diclofenac (VOLTAREN) 75 MG EC tablet Take 1 tablet (75 mg total) by mouth 2 (two) times daily. Resume 5 days post-op as needed (Patient taking differently: Take 75 mg by mouth 2 (two) times daily as needed (pain).)     gabapentin (NEURONTIN) 800 MG tablet Take 800 mg by mouth 3 (three) times daily.     hyoscyamine (OSCIMIN) 0.125 MG tablet Take 0.125 mg by mouth every 4 (four) hours as needed  (abdominal pain).     methocarbamol (ROBAXIN) 500 MG tablet Take 1 tablet (500 mg total) by mouth every 6 (six) hours as needed for muscle spasms. (Patient taking differently: Take 250 mg by mouth 4 (four) times daily as needed.) 40 tablet 1   methylPREDNISolone (MEDROL DOSEPAK) 4 MG TBPK tablet Use as directed 21 tablet 0   metoCLOPramide (REGLAN) 10 MG tablet Take 10 mg by mouth 4 (four) times daily.     ondansetron (ZOFRAN) 4 MG tablet Take 4 mg by mouth every 6 (six) hours as needed for nausea.     OVER THE COUNTER MEDICATION Estrogens     PERCOCET 10-325 MG tablet Take 1 tablet by mouth every 6 (six) hours as needed.     Probiotic Product (ALIGN PO) Take by mouth.     risperiDONE (RISPERDAL) 0.5 MG tablet Take 0.5 mg by mouth 3 (three) times daily.      No current facility-administered medications on file prior to visit.    LABS/IMAGING: No results found for this or any previous visit (from the past 48 hour(s)). No results found.  LIPID PANEL: No results found for: "CHOL", "TRIG", "HDL", "CHOLHDL", "VLDL", "LDLCALC", "LDLDIRECT"  WEIGHTS: Wt Readings from Last 3 Encounters:  04/24/22 133 lb 9.6 oz (60.6 kg)  02/07/21 115 lb (52.2 kg)  02/26/19 131 lb (59.4 kg)    VITALS: BP 108/73 (BP Location: Left Arm, Patient Position: Sitting)   Pulse 64   Ht '4\' 9"'$  (1.448 m)   Wt 133 lb 9.6 oz (60.6 kg)   SpO2 95%   BMI 28.91 kg/m   EXAM: General appearance: alert and no distress Neck: no carotid bruit, no JVD, and thyroid not enlarged, symmetric, no tenderness/mass/nodules Lungs: clear to auscultation bilaterally Heart: regular rate and rhythm, S1, S2 normal, no murmur, click, rub or gallop Abdomen: soft, non-tender; bowel sounds normal; no masses,  no organomegaly Extremities: extremities normal, atraumatic, no cyanosis or edema Pulses: 2+ and symmetric Skin: Skin color, texture, turgor normal. No rashes or lesions Neurologic: Grossly normal Psych: Pleasant  EKG: Sinus  rhythm at 64- personally reviewed  ASSESSMENT: Chest pain Probable familial hyperlipidemia (Simon Broome criteria), mother with high cholesterol and early onset cardiovascular disease Statin intolerant-myalgias  PLAN: 1.   Kristen Wilcox has been having some chest pain which sounds somewhat atypical for cardiac but she does have a very strong family history in her mom of early onset heart disease and extremely high cholesterol consistent with a familial hyperlipidemia.  Unfortunately she has been statin intolerant.  We discussed therapeutic options today including PCSK9 inhibitors but she  is very hesitant to do injectable therapies.  An oral option might be Nexlizet which would give her a 50% reduction in LDL cholesterol and his indications for heterozygous familial hyperlipidemia.  I will pursue prior authorization for this.  Might also like to get an LP(a) and NMR particle testing and if her LP(a) is high, would strongly recommend PCSK9 inhibitors as they are the only medications that would show some benefit in lowering this.  With regards to the chest discomfort, I feel that she needs a coronary evaluation.  I would recommend CT coronary angiography.  I did discuss the risk and benefits of that today and she is willing to proceed.  She will likely need 50 mg of Lopressor on the day prior to the procedure.  We will schedule that.  Plan follow-up with me afterwards.  Thanks again for the kind referral.  Pixie Casino, MD, FACC, [place] Director of the Advanced Lipid Disorders &  Cardiovascular Risk Reduction Clinic Diplomate of the American Board of Clinical Lipidology Attending Cardiologist  Direct Dial: [phone]  Fax: [phone]  Website:  [email or web address] 04/24/2022, 8:48 AM

## 2022-04-24 NOTE — Telephone Encounter (Signed)
Patient called to follow-up if the pre-authorization for her new medication was sent to her [id number] - [place], [address] AT [address]

## 2022-04-24 NOTE — Telephone Encounter (Signed)
Pt c/o medication issue:  1. Name of Medication:   Bempedoic Acid-Ezetimibe (NEXLIZET) 180-10 MG TABS    2. How are you currently taking this medication (dosage and times per day)?   3. Are you having a reaction (difficulty breathing--STAT)?   4. What is your medication issue? Pt calling for because her insurance does not cover this medication, pt states she was told she needs prior authorization

## 2022-04-24 NOTE — Telephone Encounter (Signed)
PA for nexlizet submitted via CMM (Key: [id number])

## 2022-04-25 ENCOUNTER — Other Ambulatory Visit: Payer: Self-pay | Admitting: Internal Medicine

## 2022-04-26 ENCOUNTER — Other Ambulatory Visit: Payer: Self-pay | Admitting: Internal Medicine

## 2022-04-26 NOTE — Telephone Encounter (Signed)
Request Reference Number: [id number]. NEXLIZET TAB 180/'10MG'$  is approved through 06/11/2023. Your patient may now fill this prescription and it will be covered.

## 2022-05-11 ENCOUNTER — Telehealth: Payer: Self-pay | Admitting: Internal Medicine

## 2022-05-11 NOTE — Telephone Encounter (Signed)
Pt c/o medication issue:  1. Name of Medication: unsure of the name, medication for her CT Morph  2. How are you currently taking this medication (dosage and times per day)? N/A  3. Are you having a reaction (difficulty breathing--STAT)? no  4. What is your medication issue? Patient states that she has to take a tablet 2 hrs prior to her CT on 12/06 but her pharmacy refilled the medication for a second time and now she is confused on whether or not she needs to take 2 prior to the test. Please advise. Patient is out to eat and requests a call back on Monday.

## 2022-05-14 ENCOUNTER — Telehealth: Payer: Self-pay | Admitting: Internal Medicine

## 2022-05-14 NOTE — Telephone Encounter (Signed)
Patient states she has multiple questions regarding restrictions for 12/06 CT Morphe. Please return call to discuss.

## 2022-05-14 NOTE — Telephone Encounter (Signed)
Spoke with pt regarding coronary CTA. Reviewed instructions with pt. Pt wanted to clarify when to do her blood work as well as when to take medication for procedure. Pt plans to come to the office later today to get lab work done. Pt also mentions new cholesterol medication that was started at her last visit, nexlizet. Pt states that she is trying really hard to take medication consistently but she feels like she struggles when it comes to taking cholesterol medication. All questions answered and pt verbalizes understanding.

## 2022-05-14 NOTE — Telephone Encounter (Signed)
Pt calling back today (12/4), additional encounter opened. Will close this encounter.

## 2022-05-15 ENCOUNTER — Other Ambulatory Visit: Payer: Self-pay | Admitting: Internal Medicine

## 2022-05-15 ENCOUNTER — Telehealth (HOSPITAL_COMMUNITY): Payer: Self-pay | Admitting: Emergency Medicine

## 2022-05-15 DIAGNOSIS — M8580 Other specified disorders of bone density and structure, unspecified site: Secondary | ICD-10-CM

## 2022-05-15 DIAGNOSIS — R072 Precordial pain: Secondary | ICD-10-CM | POA: Diagnosis not present

## 2022-05-15 NOTE — Telephone Encounter (Signed)
Reaching out to patient to offer assistance regarding upcoming cardiac imaging study; pt verbalizes understanding of appt date/time, parking situation and where to check in, pre-test NPO status and medications ordered, and verified current allergies; name and call back number provided for further questions should they arise Kristen Bond RN Navigator Cardiac Imaging [facility] [phone] office [phone] cell  '50mg'$  metoprolol tartrate  Arrival 130  Denies iv issues Going to lab now to get bloodwork

## 2022-05-16 ENCOUNTER — Ambulatory Visit (HOSPITAL_COMMUNITY)
Admission: RE | Admit: 2022-05-16 | Discharge: 2022-05-16 | Disposition: A | Discharge status: Home / Self Care | Payer: Medicare Other | Source: Ambulatory Visit | Attending: Internal Medicine | Admitting: Internal Medicine

## 2022-05-16 DIAGNOSIS — R072 Precordial pain: Secondary | ICD-10-CM | POA: Diagnosis not present

## 2022-05-16 LAB — BASIC METABOLIC PANEL
BUN/Creatinine Ratio: 17 (ref 12–28)
BUN: 15 mg/dL (ref 8–27)
CO2: 25 mmol/L (ref 20–29)
Calcium: 9.7 mg/dL (ref 8.7–10.3)
Chloride: 100 mmol/L (ref 96–106)
Creatinine, Ser: 0.86 mg/dL (ref 0.57–1.00)
Glucose: 84 mg/dL (ref 70–99)
Potassium: 4.2 mmol/L (ref 3.5–5.2)
Sodium: 140 mmol/L (ref 134–144)
eGFR: 74 mL/min/{1.73_m2} (ref >59)

## 2022-05-16 MED ORDER — NITROGLYCERIN 0.4 MG SL SUBL
0.8000 mg | SUBLINGUAL_TABLET | Freq: Once | SUBLINGUAL | Status: AC
  Administered 2022-05-16: 0.8 mg via SUBLINGUAL

## 2022-05-16 MED ORDER — IOHEXOL 350 MG/ML SOLN
100.0000 mL | Freq: Once | INTRAVENOUS | Status: AC | PRN
  Administered 2022-05-16: 100 mL via INTRAVENOUS

## 2022-05-16 MED ORDER — NITROGLYCERIN 0.4 MG SL SUBL
SUBLINGUAL_TABLET | SUBLINGUAL | Status: AC
  Filled 2022-05-16: qty 2

## 2022-05-16 NOTE — Telephone Encounter (Signed)
Patient stated she wants to hold off taking nexlizet until after CCTA because the medication makes her dizzy during the day. She wants to know if there is an alternative to nexlizet.

## 2022-05-16 NOTE — Telephone Encounter (Signed)
   Pt is calling back to f/u her concern about her cholesterol medication. She said, she will not take it today since she has a CT at 2 pm and doesn't want to feel sleepy and will wait for Dr. Lysbeth Penner recommendation before taking it again

## 2022-05-16 NOTE — Telephone Encounter (Signed)
Ok.. hold off on the [place] for now and see if the dizziness improves. We can look at options after the CT scan.  Dr Lemmie Evens

## 2022-05-17 ENCOUNTER — Telehealth: Payer: Self-pay | Admitting: Internal Medicine

## 2022-05-17 NOTE — Telephone Encounter (Signed)
Follow Up:     Patient is calling to find out if her CT Cardiac Morph results are ready please?

## 2022-05-17 NOTE — Telephone Encounter (Signed)
Routed to MD to advise on medication plan since patient reported dizziness w/Nexlizet

## 2022-05-17 NOTE — Telephone Encounter (Signed)
Patient stated she will stop taking Nexlizet stating today. She will keep [place] informed about the dizziness. She is waiting to hear from our clinic about options.

## 2022-05-17 NOTE — Telephone Encounter (Signed)
Patient asked if results are ready for CT and blood work. She also reported that she has not been dizzy today since not taking nexlizet.

## 2022-05-18 MED ORDER — EZETIMIBE 10 MG PO TABS: 10.0000 mg | ORAL_TABLET | Freq: Every day | ORAL | 3 refills | Status: DC

## 2022-05-18 NOTE — Telephone Encounter (Signed)
Pixie Casino, MD 05/17/2022  3:09 PM EST     Reassuring coronary CT - mild non-obstructive CAD with a lower calcium score. No blockages.   Dr Debara Pickett  Pt informed of providers result & recommendations. Pt verbalized understanding. All questions, if any, were answered. She will start ASAP.

## 2022-05-18 NOTE — Telephone Encounter (Signed)
Pt calling to f/u on results. Please advise

## 2022-05-18 NOTE — Telephone Encounter (Signed)
Remain off the Nexlizet.  Not sure what part of that combination medicine would be causing dizziness-  would try zetia 10 mg daily by itself. Repeat lipid and follow-up as scheduled.  Dr Lemmie Evens

## 2022-05-21 DIAGNOSIS — L821 Other seborrheic keratosis: Secondary | ICD-10-CM | POA: Diagnosis not present

## 2022-05-21 DIAGNOSIS — C44311 Basal cell carcinoma of skin of nose: Secondary | ICD-10-CM | POA: Diagnosis not present

## 2022-05-21 DIAGNOSIS — L719 Rosacea, unspecified: Secondary | ICD-10-CM | POA: Diagnosis not present

## 2022-05-22 ENCOUNTER — Other Ambulatory Visit: Payer: Medicare Other

## 2022-06-27 DIAGNOSIS — C44311 Basal cell carcinoma of skin of nose: Secondary | ICD-10-CM | POA: Diagnosis not present

## 2022-07-12 DIAGNOSIS — C44311 Basal cell carcinoma of skin of nose: Secondary | ICD-10-CM | POA: Diagnosis not present

## 2022-08-07 DIAGNOSIS — M961 Postlaminectomy syndrome, not elsewhere classified: Secondary | ICD-10-CM | POA: Diagnosis not present

## 2022-08-07 DIAGNOSIS — Z79891 Long term (current) use of opiate analgesic: Secondary | ICD-10-CM | POA: Diagnosis not present

## 2022-08-07 DIAGNOSIS — G894 Chronic pain syndrome: Secondary | ICD-10-CM | POA: Diagnosis not present

## 2022-08-07 DIAGNOSIS — M5416 Radiculopathy, lumbar region: Secondary | ICD-10-CM | POA: Diagnosis not present

## 2022-08-15 ENCOUNTER — Ambulatory Visit: Payer: Medicare Other | Attending: Internal Medicine | Admitting: Internal Medicine

## 2022-08-19 DIAGNOSIS — M5412 Radiculopathy, cervical region: Secondary | ICD-10-CM | POA: Diagnosis not present

## 2022-08-19 DIAGNOSIS — M25511 Pain in right shoulder: Secondary | ICD-10-CM | POA: Diagnosis not present

## 2022-08-19 DIAGNOSIS — M5033 Other cervical disc degeneration, cervicothoracic region: Secondary | ICD-10-CM | POA: Diagnosis not present

## 2022-08-19 DIAGNOSIS — M542 Cervicalgia: Secondary | ICD-10-CM | POA: Diagnosis not present

## 2022-08-22 DIAGNOSIS — M25511 Pain in right shoulder: Secondary | ICD-10-CM | POA: Diagnosis not present

## 2022-08-29 DIAGNOSIS — S4291XD Fracture of right shoulder girdle, part unspecified, subsequent encounter for fracture with routine healing: Secondary | ICD-10-CM | POA: Diagnosis not present

## 2022-08-29 DIAGNOSIS — T466X5A Adverse effect of antihyperlipidemic and antiarteriosclerotic drugs, initial encounter: Secondary | ICD-10-CM | POA: Diagnosis not present

## 2022-08-29 DIAGNOSIS — K589 Irritable bowel syndrome without diarrhea: Secondary | ICD-10-CM | POA: Diagnosis not present

## 2022-08-29 DIAGNOSIS — M545 Low back pain, unspecified: Secondary | ICD-10-CM | POA: Diagnosis not present

## 2022-08-29 DIAGNOSIS — Z0000 Encounter for general adult medical examination without abnormal findings: Secondary | ICD-10-CM | POA: Diagnosis not present

## 2022-08-29 DIAGNOSIS — M8588 Other specified disorders of bone density and structure, other site: Secondary | ICD-10-CM | POA: Diagnosis not present

## 2022-08-29 DIAGNOSIS — G720 Drug-induced myopathy: Secondary | ICD-10-CM | POA: Diagnosis not present

## 2022-08-29 DIAGNOSIS — Z85828 Personal history of other malignant neoplasm of skin: Secondary | ICD-10-CM | POA: Diagnosis not present

## 2022-08-29 DIAGNOSIS — G8929 Other chronic pain: Secondary | ICD-10-CM | POA: Diagnosis not present

## 2022-09-19 DIAGNOSIS — S42254D Nondisplaced fracture of greater tuberosity of right humerus, subsequent encounter for fracture with routine healing: Secondary | ICD-10-CM | POA: Diagnosis not present

## 2022-10-11 DIAGNOSIS — M25511 Pain in right shoulder: Secondary | ICD-10-CM | POA: Diagnosis not present

## 2022-10-16 ENCOUNTER — Encounter (HOSPITAL_BASED_OUTPATIENT_CLINIC_OR_DEPARTMENT_OTHER): Payer: Self-pay | Admitting: Emergency Medicine

## 2022-10-16 ENCOUNTER — Other Ambulatory Visit: Payer: Self-pay

## 2022-10-16 ENCOUNTER — Emergency Department (HOSPITAL_BASED_OUTPATIENT_CLINIC_OR_DEPARTMENT_OTHER)
Admission: EM | Admit: 2022-10-16 | Discharge: 2022-10-16 | Disposition: A | Discharge status: Home / Self Care | Payer: Medicare Other | Attending: Emergency Medicine | Admitting: Emergency Medicine

## 2022-10-16 DIAGNOSIS — R000 Tachycardia, unspecified: Secondary | ICD-10-CM | POA: Insufficient documentation

## 2022-10-16 DIAGNOSIS — R197 Diarrhea, unspecified: Secondary | ICD-10-CM | POA: Diagnosis not present

## 2022-10-16 DIAGNOSIS — R1033 Periumbilical pain: Secondary | ICD-10-CM | POA: Diagnosis not present

## 2022-10-16 DIAGNOSIS — R1013 Epigastric pain: Secondary | ICD-10-CM | POA: Insufficient documentation

## 2022-10-16 DIAGNOSIS — E860 Dehydration: Secondary | ICD-10-CM

## 2022-10-16 LAB — COMPREHENSIVE METABOLIC PANEL
ALT: 79 U/L — ABNORMAL HIGH (ref 0–44)
AST: 43 U/L — ABNORMAL HIGH (ref 15–41)
Albumin: 3.1 g/dL — ABNORMAL LOW (ref 3.5–5.0)
Alkaline Phosphatase: 173 U/L — ABNORMAL HIGH (ref 38–126)
Anion gap: 10 (ref 5–15)
BUN: 23 mg/dL (ref 8–23)
CO2: 23 mmol/L (ref 22–32)
Calcium: 8.1 mg/dL — ABNORMAL LOW (ref 8.9–10.3)
Chloride: 101 mmol/L (ref 98–111)
Creatinine, Ser: 0.97 mg/dL (ref 0.44–1.00)
GFR, Estimated: >60 mL/min (ref >60)
Glucose, Bld: 142 mg/dL — ABNORMAL HIGH (ref 70–99)
Potassium: 3.9 mmol/L (ref 3.5–5.1)
Sodium: 134 mmol/L — ABNORMAL LOW (ref 135–145)
Total Bilirubin: 0.4 mg/dL (ref 0.3–1.2)
Total Protein: 6.4 g/dL — ABNORMAL LOW (ref 6.5–8.1)

## 2022-10-16 LAB — CBC WITH DIFFERENTIAL/PLATELET
Abs Immature Granulocytes: 0.03 10*3/uL (ref 0.00–0.07)
Basophils Absolute: 0 10*3/uL (ref 0.0–0.1)
Basophils Relative: 0 %
Eosinophils Absolute: 0.2 10*3/uL (ref 0.0–0.5)
Eosinophils Relative: 3 %
HCT: 36.9 % (ref 36.0–46.0)
Hemoglobin: 12.4 g/dL (ref 12.0–15.0)
Immature Granulocytes: 0 %
Lymphocytes Relative: 14 %
Lymphs Abs: 1.1 10*3/uL (ref 0.7–4.0)
MCH: 29.3 pg (ref 26.0–34.0)
MCHC: 33.6 g/dL (ref 30.0–36.0)
MCV: 87.2 fL (ref 80.0–100.0)
Monocytes Absolute: 0.9 10*3/uL (ref 0.1–1.0)
Monocytes Relative: 12 %
Neutro Abs: 5.1 10*3/uL (ref 1.7–7.7)
Neutrophils Relative %: 71 %
Platelets: 138 10*3/uL — ABNORMAL LOW (ref 150–400)
RBC: 4.23 MIL/uL (ref 3.87–5.11)
RDW: 13.1 % (ref 11.5–15.5)
WBC Morphology: INCREASED
WBC: 7.3 10*3/uL (ref 4.0–10.5)
nRBC: 0 % (ref 0.0–0.2)

## 2022-10-16 LAB — MAGNESIUM: Magnesium: 1.9 mg/dL (ref 1.7–2.4)

## 2022-10-16 MED ORDER — LACTATED RINGERS IV BOLUS
1000.0000 mL | Freq: Once | INTRAVENOUS | Status: AC
  Administered 2022-10-16: 1000 mL via INTRAVENOUS

## 2022-10-16 NOTE — ED Triage Notes (Signed)
Diarrhea x 2 weeks , x3 daily , lethargy .

## 2022-10-16 NOTE — ED Provider Notes (Signed)
[facility] Provider Note   CSN: [id number] Arrival date & time: 10/16/22  0801     History  Chief Complaint  Patient presents with   Diarrhea    Kristen Wilcox is a 67 y.o. female.  Patient is a 67 year old female with a history of depression and anxiety, prior history of intermittent abdominal pain and diarrhea who is presenting today with a 2-week history of diarrhea that has been persistent.  She does report starting a new medication from her doctor for her hormones and feels that that may have been what started the diarrhea but she discontinued it and thought the diarrhea was getting better.  She had been taking Imodium as needed.  Yesterday she only had 1 episode of diarrhea but did not really have much to eat at all yesterday.  She woke up in the [place] had another episode of diarrhea and was feeling weak, dizzy but denies syncope.  She reports she was dropping things and felt like she just could not get herself together.  She had nausea this morning but no vomiting.  She denies any blood in her stool, recent antibiotics or travel.  She has had a up-to-date colonoscopy which has been normal and does not know why she gets his episodes of diarrhea but reports its never been this bad before.  GI has done multiple test on her gallbladder and she states everything with her gallbladder is normal.  The history is provided by the patient and medical records.  Diarrhea      Home Medications Prior to Admission medications   Medication Sig Start Date End Date Taking? Authorizing Provider  ALPRAZolam Prudy Feeler) 0.5 MG tablet Take 0.5 mg by mouth 2 (two) times daily as needed for anxiety.     [provider]  Bempedoic Acid-Ezetimibe (NEXLIZET) 180-10 MG TABS Take 1 tablet by mouth daily. 04/24/22   Hilty, Lisette Abu, MD  benztropine (COGENTIN) 1 MG tablet Take 1 mg by mouth daily. Patient is taking 2 times daily    [provider]   CALCIUM PO Take by mouth.    [provider]  diclofenac (VOLTAREN) 75 MG EC tablet Take 1 tablet (75 mg total) by mouth 2 (two) times daily. Resume 5 days post-op as needed Patient taking differently: Take 75 mg by mouth 2 (two) times daily as needed (pain). 08/16/16   Dorothy Spark, PA-C  ezetimibe (ZETIA) 10 MG tablet Take 1 tablet (10 mg total) by mouth daily. 05/18/22 08/16/22  Chrystie Nose, MD  gabapentin (NEURONTIN) 800 MG tablet Take 800 mg by mouth 3 (three) times daily.    [provider]  hyoscyamine (OSCIMIN) 0.125 MG tablet Take 0.125 mg by mouth every 4 (four) hours as needed (abdominal pain).    [provider]  methocarbamol (ROBAXIN) 500 MG tablet Take 1 tablet (500 mg total) by mouth every 6 (six) hours as needed for muscle spasms. Patient taking differently: Take 250 mg by mouth 4 (four) times daily as needed. 08/15/16   Jene Every, MD  methylPREDNISolone (MEDROL DOSEPAK) 4 MG TBPK tablet Use as directed 02/07/21   Smoot, Sarah A, PA-C  metoCLOPramide (REGLAN) 10 MG tablet Take 10 mg by mouth 4 (four) times daily. 04/23/22   [provider]  metoprolol tartrate (LOPRESSOR) 50 MG tablet TAKE 1 TABLET BY MOUTH TWO HOURS PRIOR TO CT TEST 04/25/22   Hilty, Lisette Abu, MD  ondansetron (ZOFRAN) 4 MG tablet Take 4  mg by mouth every 6 (six) hours as needed for nausea.    [provider]  OVER THE COUNTER MEDICATION Estrogens    [provider]  PERCOCET 10-325 MG tablet Take 1 tablet by mouth every 6 (six) hours as needed. 03/26/22   [provider]  Probiotic Product (ALIGN PO) Take by mouth.    [provider]  risperiDONE (RISPERDAL) 0.5 MG tablet Take 0.5 mg by mouth 3 (three) times daily.     [provider]      Allergies    Baclofen, Citalopram, Cymbalta [duloxetine hcl], Other, Penicillins, Septra [sulfamethoxazole-trimethoprim], Zoloft [sertraline hcl], and Zonisamide    Review of Systems    Review of Systems  Gastrointestinal:  Positive for diarrhea.    Physical Exam Updated Vital Signs BP 135/75   Pulse (!) 105   Temp 98.2 F (36.8 C)   Resp 18   Wt 62.6 kg   SpO2 94%   BMI 29.86 kg/m  Physical Exam Vitals and nursing note reviewed.  Constitutional:      General: She is not in acute distress.    Appearance: She is well-developed.  HENT:     Head: Normocephalic and atraumatic.     Mouth/Throat:     Mouth: Mucous membranes are dry.  Eyes:     Pupils: Pupils are equal, round, and reactive to light.  Cardiovascular:     Rate and Rhythm: Regular rhythm. Tachycardia present.     Heart sounds: Normal heart sounds. No murmur heard.    No friction rub.  Pulmonary:     Effort: Pulmonary effort is normal.     Breath sounds: Normal breath sounds. No wheezing or rales.  Abdominal:     General: Bowel sounds are normal. There is no distension.     Palpations: Abdomen is soft.     Tenderness: There is abdominal tenderness in the epigastric area and periumbilical area. There is no guarding or rebound.  Musculoskeletal:        General: No tenderness. Normal range of motion.     Comments: No edema  Skin:    General: Skin is warm and dry.     Findings: No rash.  Neurological:     Mental Status: She is alert and oriented to person, place, and time. Mental status is at baseline.     Cranial Nerves: No cranial nerve deficit.     Sensory: No sensory deficit.     Motor: No weakness.  Psychiatric:        Behavior: Behavior normal.     ED Results / Procedures / Treatments   Labs (all labs ordered are listed, but only abnormal results are displayed) Labs Reviewed  CBC WITH DIFFERENTIAL/PLATELET - Abnormal; Notable for the following components:      Result Value   Platelets 138 (*)    All other components within normal limits  COMPREHENSIVE METABOLIC PANEL - Abnormal; Notable for the following components:   Sodium 134 (*)    Glucose, Bld 142 (*)    Calcium 8.1 (*)     Total Protein 6.4 (*)    Albumin 3.1 (*)    AST 43 (*)    ALT 79 (*)    Alkaline Phosphatase 173 (*)    All other components within normal limits  MAGNESIUM    EKG EKG Interpretation  Date/Time:  Tuesday Oct 16 2022 08:46:10 EDT Ventricular Rate:  97 PR Interval:  124 QRS Duration: 80 QT Interval:  344 QTC Calculation:  437 R Axis:   76 Text Interpretation: Sinus rhythm Low voltage, extremity leads No significant change since last tracing Confirmed by Gwyneth Sprout ([id number]) on 10/16/2022 9:52:14 AM  Radiology No results found.  Procedures Procedures    Medications Ordered in ED Medications  lactated ringers bolus 1,000 mL (0 mLs Intravenous Stopped 10/16/22 1016)    ED Course/ Medical Decision Making/ A&P                             Medical Decision Making Amount and/or Complexity of Data Reviewed Labs: ordered. Decision-making details documented in ED Course. ECG/medicine tests: ordered and independent interpretation performed. Decision-making details documented in ED Course.   Pt with multiple medical problems and comorbidities and presenting today with a complaint that caries a high risk for morbidity and mortality.  Patient presenting today with complaints of 2 weeks of diarrhea which had started to get better but feeling poorly today.  Concern for dehydration, electrolyte abnormality, colitis.  Low suspicion for C. difficile, foodborne illness.  Low suspicion for cardiac or lung pathology.  Hemodynamically stable at this time but mildly tachycardic.  Patient given IV fluids, labs are pending.  I independently interpreted patient's EKG which shows no acute findings today. 11:06 AM  CBC with normal white count and hemoglobin, platelets mildly decreased at 138 but clotted, they did remark that patient had increased bands but is having no infectious symptoms such as fever, abdominal pain and differential is normal.  CMP with mild elevated LFTs at 43 and 79 but normal  total bilirubin, magnesium normal, potassium normal.  On repeat evaluation after fluids patient reports feeling much better.  She found the medication that she had been given which was magnesium which is most likely the cause of her diarrhea.  Results were discussed with she and her husband.  No indication for further testing at this time.  Feel that patient is stable for discharge home.       Final Clinical Impression(s) / ED Diagnoses Final diagnoses:  Diarrhea, unspecified type  Dehydration    Rx / DC Orders ED Discharge Orders     None         Gwyneth Sprout, MD 10/16/22 1107

## 2022-10-16 NOTE — Discharge Instructions (Addendum)
Make sure you are staying hydrated but otherwise your labs look good today

## 2022-10-23 DIAGNOSIS — M25511 Pain in right shoulder: Secondary | ICD-10-CM | POA: Diagnosis not present

## 2022-11-01 ENCOUNTER — Ambulatory Visit
Admission: RE | Admit: 2022-11-01 | Discharge: 2022-11-01 | Disposition: A | Discharge status: Home / Self Care | Payer: Medicare Other | Source: Ambulatory Visit | Attending: Internal Medicine | Admitting: Internal Medicine

## 2022-11-01 ENCOUNTER — Inpatient Hospital Stay: Admission: RE | Admit: 2022-11-01 | Payer: Medicare Other | Source: Ambulatory Visit

## 2022-11-01 DIAGNOSIS — E349 Endocrine disorder, unspecified: Secondary | ICD-10-CM | POA: Diagnosis not present

## 2022-11-01 DIAGNOSIS — M810 Age-related osteoporosis without current pathological fracture: Secondary | ICD-10-CM | POA: Diagnosis not present

## 2022-11-01 DIAGNOSIS — M8580 Other specified disorders of bone density and structure, unspecified site: Secondary | ICD-10-CM

## 2022-11-28 DIAGNOSIS — Z79891 Long term (current) use of opiate analgesic: Secondary | ICD-10-CM | POA: Diagnosis not present

## 2022-11-28 DIAGNOSIS — G894 Chronic pain syndrome: Secondary | ICD-10-CM | POA: Diagnosis not present

## 2022-11-28 DIAGNOSIS — M961 Postlaminectomy syndrome, not elsewhere classified: Secondary | ICD-10-CM | POA: Diagnosis not present

## 2022-11-28 DIAGNOSIS — M5416 Radiculopathy, lumbar region: Secondary | ICD-10-CM | POA: Diagnosis not present

## 2023-01-09 DIAGNOSIS — M25511 Pain in right shoulder: Secondary | ICD-10-CM | POA: Diagnosis not present

## 2023-01-24 DIAGNOSIS — M25511 Pain in right shoulder: Secondary | ICD-10-CM | POA: Diagnosis not present

## 2023-02-04 DIAGNOSIS — M25511 Pain in right shoulder: Secondary | ICD-10-CM | POA: Diagnosis not present

## 2023-03-01 DIAGNOSIS — E7800 Pure hypercholesterolemia, unspecified: Secondary | ICD-10-CM | POA: Diagnosis not present

## 2023-03-01 DIAGNOSIS — M545 Low back pain, unspecified: Secondary | ICD-10-CM | POA: Diagnosis not present

## 2023-03-01 DIAGNOSIS — Z23 Encounter for immunization: Secondary | ICD-10-CM | POA: Diagnosis not present

## 2023-03-01 DIAGNOSIS — M8588 Other specified disorders of bone density and structure, other site: Secondary | ICD-10-CM | POA: Diagnosis not present

## 2023-03-01 DIAGNOSIS — K589 Irritable bowel syndrome without diarrhea: Secondary | ICD-10-CM | POA: Diagnosis not present

## 2023-03-01 DIAGNOSIS — T466X5A Adverse effect of antihyperlipidemic and antiarteriosclerotic drugs, initial encounter: Secondary | ICD-10-CM | POA: Diagnosis not present

## 2023-03-13 DIAGNOSIS — E785 Hyperlipidemia, unspecified: Secondary | ICD-10-CM | POA: Diagnosis not present

## 2023-03-27 DIAGNOSIS — Z9189 Other specified personal risk factors, not elsewhere classified: Secondary | ICD-10-CM | POA: Diagnosis not present

## 2023-03-27 DIAGNOSIS — M961 Postlaminectomy syndrome, not elsewhere classified: Secondary | ICD-10-CM | POA: Diagnosis not present

## 2023-03-27 DIAGNOSIS — M25511 Pain in right shoulder: Secondary | ICD-10-CM | POA: Diagnosis not present

## 2023-03-27 DIAGNOSIS — M5416 Radiculopathy, lumbar region: Secondary | ICD-10-CM | POA: Diagnosis not present

## 2023-03-27 DIAGNOSIS — G894 Chronic pain syndrome: Secondary | ICD-10-CM | POA: Diagnosis not present

## 2023-03-27 DIAGNOSIS — Z79899 Other long term (current) drug therapy: Secondary | ICD-10-CM | POA: Diagnosis not present

## 2023-03-27 DIAGNOSIS — Z79891 Long term (current) use of opiate analgesic: Secondary | ICD-10-CM | POA: Diagnosis not present

## 2023-03-27 DIAGNOSIS — Z5181 Encounter for therapeutic drug level monitoring: Secondary | ICD-10-CM | POA: Diagnosis not present

## 2023-04-01 DIAGNOSIS — E785 Hyperlipidemia, unspecified: Secondary | ICD-10-CM | POA: Diagnosis not present

## 2023-04-15 DIAGNOSIS — E7800 Pure hypercholesterolemia, unspecified: Secondary | ICD-10-CM | POA: Diagnosis not present

## 2023-04-29 DIAGNOSIS — E7800 Pure hypercholesterolemia, unspecified: Secondary | ICD-10-CM | POA: Diagnosis not present

## 2023-08-06 IMAGING — MG MM DIGITAL SCREENING BILAT W/ TOMO AND CAD
8 series · 8 of 24 positions shown · non-contrast
Comparison: Previous exam(s).

CLINICAL DATA: Screening.

EXAM:
DIGITAL SCREENING BILATERAL MAMMOGRAM WITH TOMOSYNTHESIS AND CAD
TECHNIQUE: Bilateral screening digital craniocaudal and mediolateral oblique
mammograms were obtained. Bilateral screening digital breast
tomosynthesis was performed. The images were evaluated with
computer-aided detection.

[R CC synth-2D]
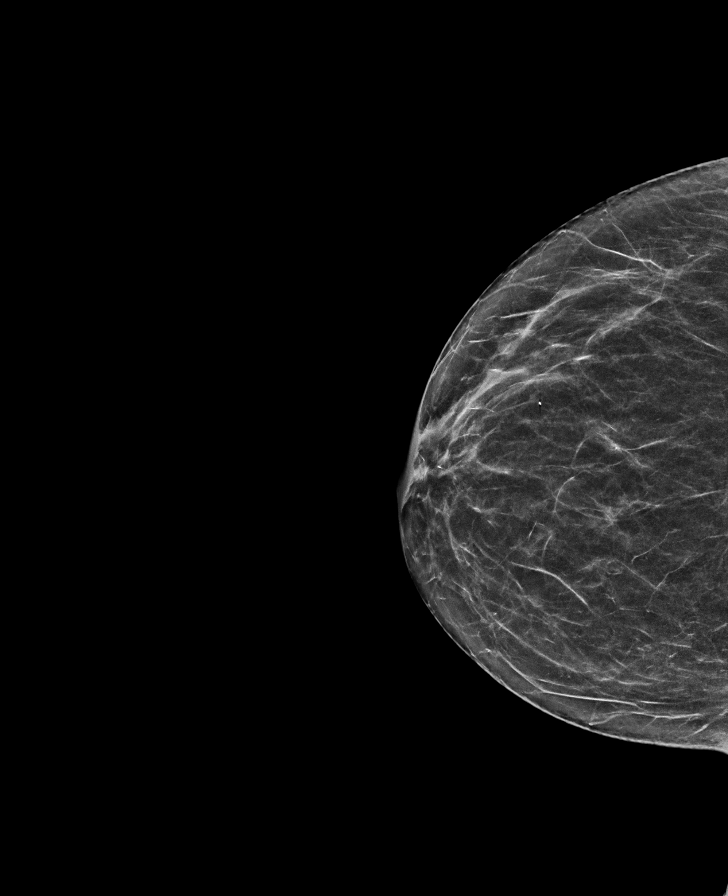

[R MLO synth-2D]
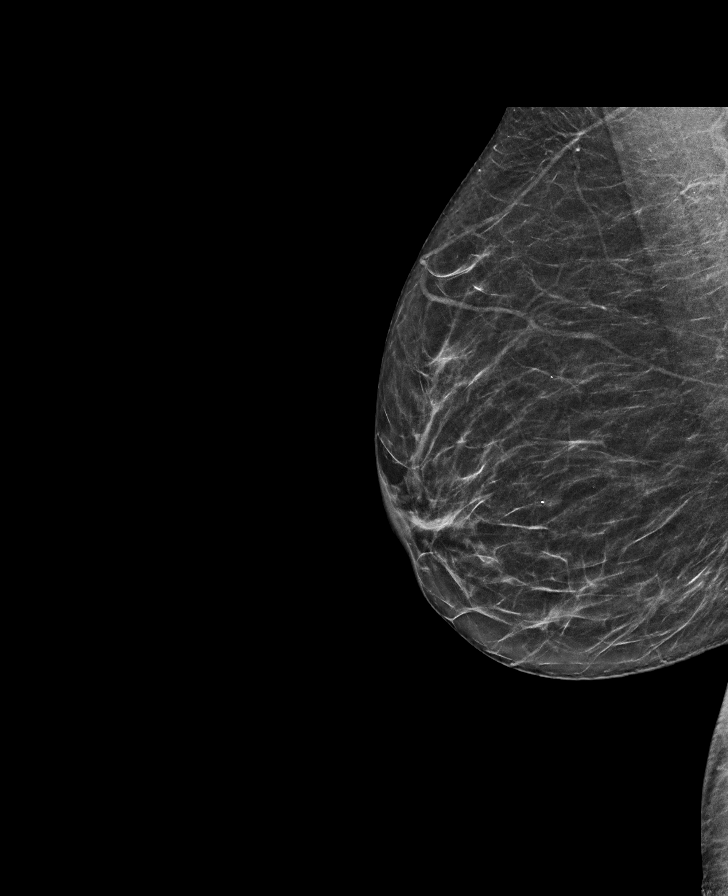

[L CC synth-2D]
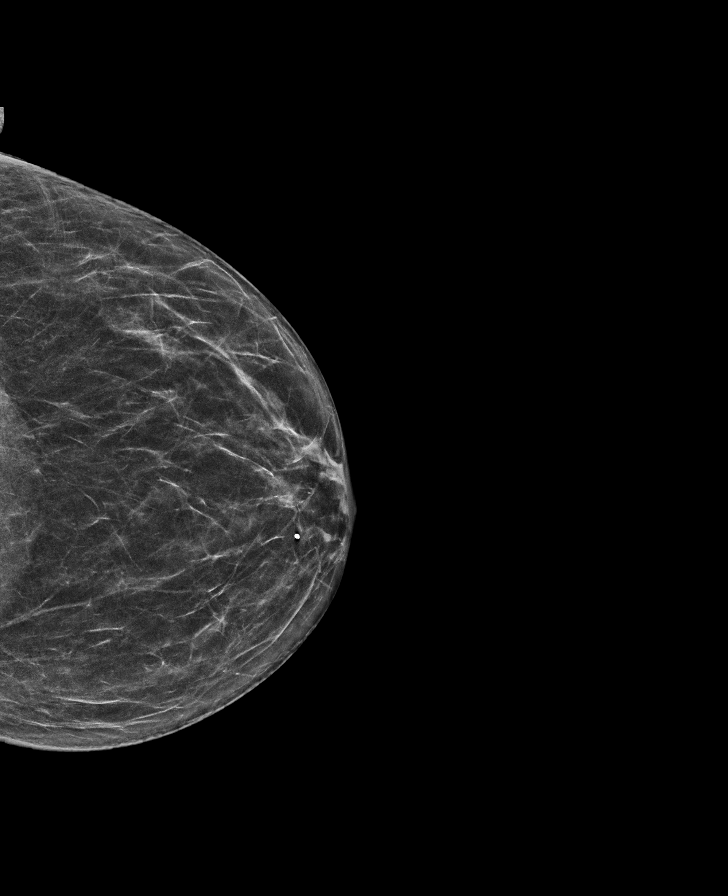

[L MLO synth-2D]
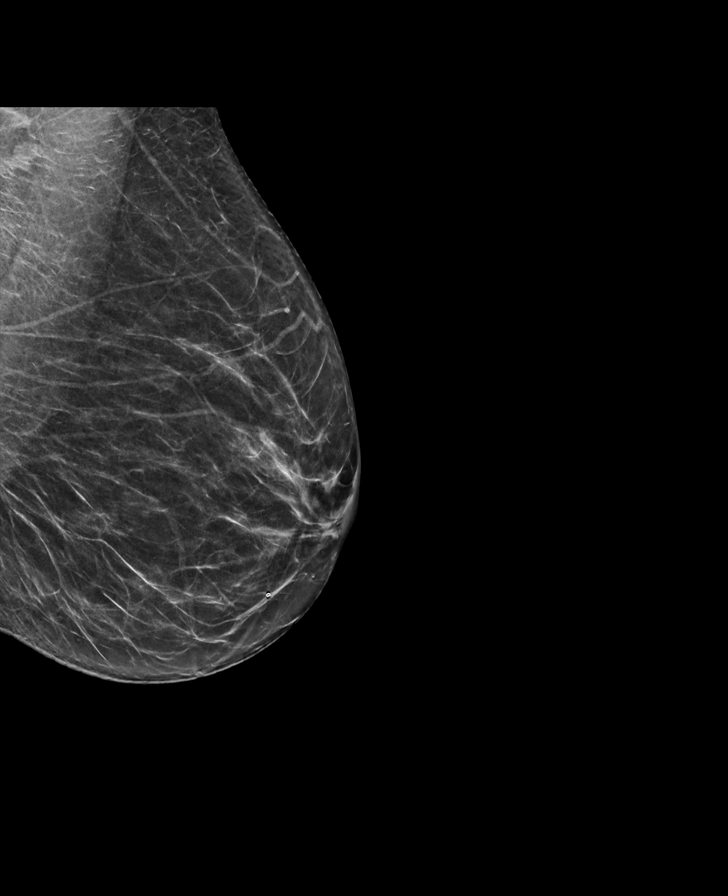

[R CC tomo · tomo slice 30/59.0]
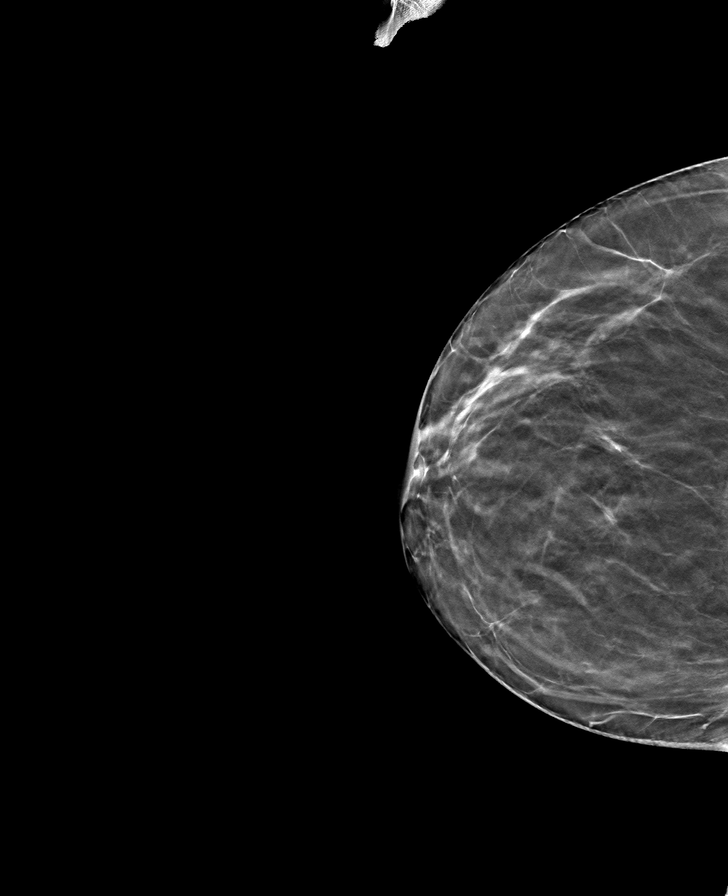

[L CC tomo · tomo slice 30/59.0]
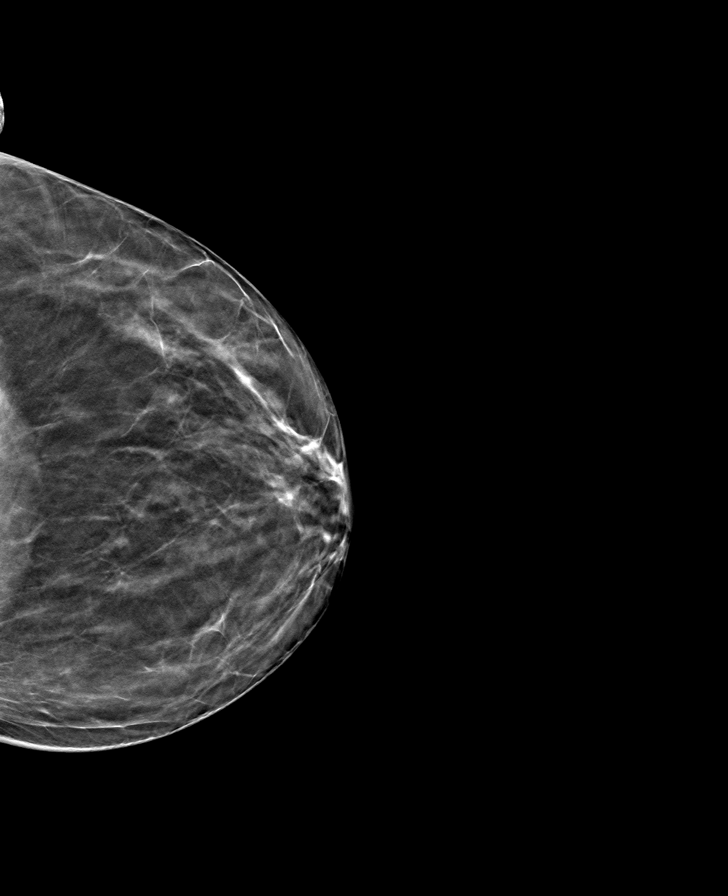

[R MLO tomo · tomo slice 31/60.0]
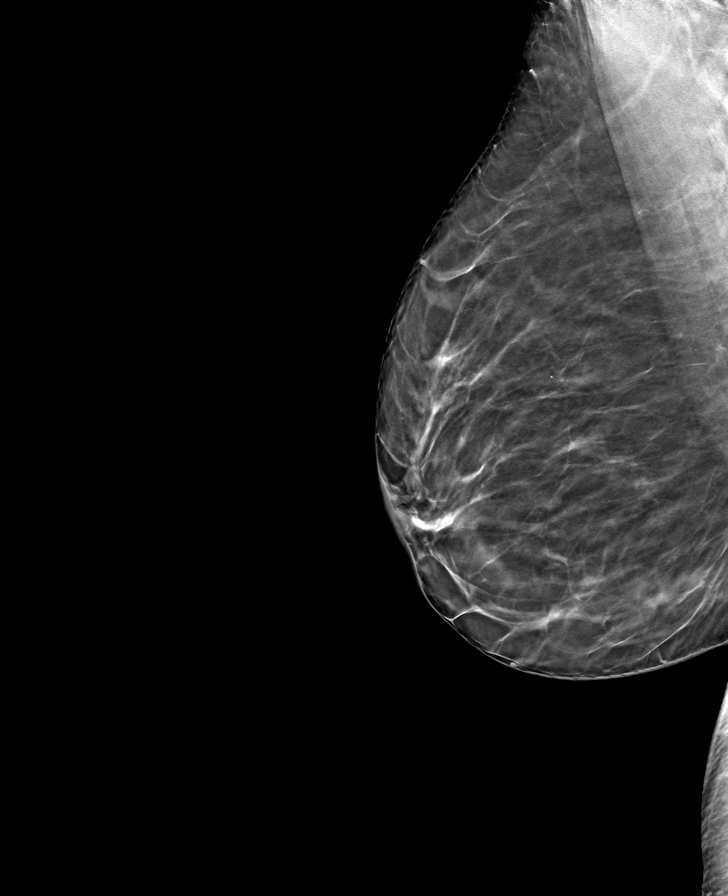

[L MLO tomo · tomo slice 32/63.0]
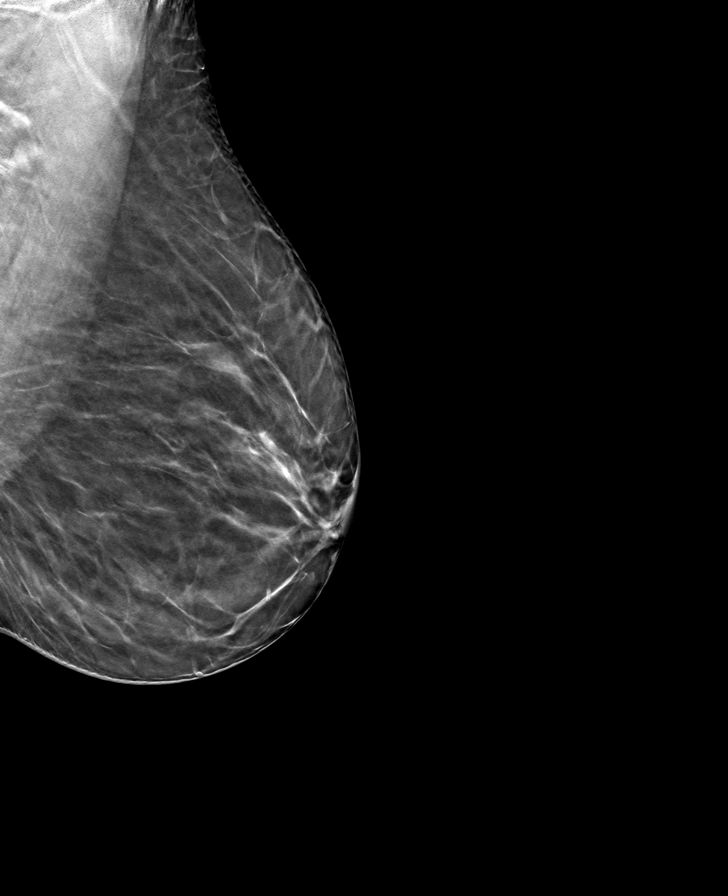

[8 of 24 positions shown; findings below may reference images not displayed]

ACR Breast Density Category b: There are scattered areas of
fibroglandular density.
FINDINGS: There are no findings suspicious for malignancy.
IMPRESSION: No mammographic evidence of malignancy. A result letter of this
screening mammogram will be mailed directly to the patient.

RECOMMENDATION:
Screening mammogram in one year. (Code:51-O-LD2)

BI-RADS CATEGORY  1: Negative.

## 2023-12-19 NOTE — Patient Instructions (Signed)
 SURGICAL WAITING ROOM VISITATION Patients having surgery or a procedure may have no more than 2 support people in the waiting area - these visitors may rotate in the visitor waiting room.   If the patient needs to stay at the hospital during part of their recovery, the visitor guidelines for inpatient rooms apply.  PRE-OP VISITATION  Pre-op nurse will coordinate an appropriate time for 1 support person to accompany the patient in pre-op.  This support person may not rotate.  This visitor will be contacted when the time is appropriate for the visitor to come back in the pre-op area.  Please refer to the [facility] website for the visitor guidelines for Inpatients (after your surgery is over and you are in a regular room).  You are not required to quarantine at this time prior to your surgery. However, you must do this: Hand Hygiene often Do NOT share personal items Notify your provider if you are in close contact with someone who has COVID or you develop fever 100.4 or greater, new onset of sneezing, cough, sore throat, shortness of breath or body aches.  If you test positive for Covid or have been in contact with anyone that has tested positive in the last 10 days please notify you surgeon.    Your procedure is scheduled on:  Thursday  January 02, 2024  Report to [facility] Main Entrance: [address] entrance where the [employer] is available.   Report to admitting at: 05:15    AM  Call this number if you have any questions or problems the morning of surgery [phone]  Do not eat food after Midnight the night prior to your surgery/procedure.  After Midnight you may have the following liquids until  04:30 AM  DAY OF SURGERY  Clear Liquid Diet Water Black Coffee (sugar ok, NO MILK/CREAM OR CREAMERS)  Tea (sugar ok, NO MILK/CREAM OR CREAMERS) regular and decaf                             Plain Jell-O  with no fruit (NO RED)                                           Fruit ices  (not with fruit pulp, NO RED)                                     Popsicles (NO RED)                                                                  Juice: NO CITRUS JUICES: only apple, WHITE grape, WHITE cranberry Sports drinks like Gatorade or Powerade (NO RED)                   The day of surgery:  Drink ONE (1) Pre-Surgery Clear Ensure at  04:30  AM the morning of surgery. Drink in one sitting. Do not sip.  This drink was given to you during your hospital pre-op appointment visit. Nothing else to drink  after completing the Pre-Surgery Clear Ensure : No candy, chewing gum or throat lozenges.    FOLLOW ANY ADDITIONAL PRE OP INSTRUCTIONS YOU RECEIVED FROM YOUR SURGEON'S OFFICE!!!   Oral Hygiene is also important to reduce your risk of infection.        Remember - BRUSH YOUR TEETH THE MORNING OF SURGERY WITH YOUR REGULAR TOOTHPASTE  Do NOT smoke after Midnight the night before surgery.  STOP TAKING all Vitamins, Herbs and supplements 1 week before your surgery.   Take ONLY these medicines the morning of surgery with A SIP OF WATER: Risperidone, Benztropine , Gabapentin , and Alprazolam  if needed. You may take oxycodone - APAP if needed for moderate / severe pain.                     You may not have any metal on your body including hair pins, jewelry, and body piercing  Do not wear make-up, lotions, powders, perfumes or deodorant  Do not wear nail polish including gel and S&S, artificial / acrylic nails, or any other type of covering on natural nails including finger and toenails. If you have artificial nails, gel coating, etc., that needs to be removed by a nail salon, Please have this removed prior to surgery. Not doing so may mean that your surgery could be cancelled or delayed if the Surgeon or anesthesia staff feels like they are unable to monitor you safely.   Do not shave 48 hours prior to surgery to avoid nicks in your skin which may contribute to postoperative infections.    Contacts, Hearing Aids, dentures or bridgework may not be worn into surgery. DENTURES WILL BE REMOVED PRIOR TO SURGERY PLEASE DO NOT APPLY Poly grip OR ADHESIVES!!!  Patients discharged on the day of surgery will not be allowed to drive home.  Someone NEEDS to stay with you for the first 24 hours after anesthesia.  Do not bring your home medications to the hospital. The Pharmacy will dispense medications listed on your medication list to you during your admission in the Hospital.  Special Instructions: Bring a copy of your healthcare power of attorney and living will documents the day of surgery, if you wish to have them scanned into your [facility]- EPIC  Please read over the following fact sheets you were given: IF YOU HAVE QUESTIONS ABOUT YOUR PRE-OP INSTRUCTIONS, PLEASE CALL [phone].     Pre-operative 5 CHG Bath Instructions   You can play a key role in reducing the risk of infection after surgery. Your skin needs to be as free of germs as possible. You can reduce the number of germs on your skin by washing with CHG (chlorhexidine  gluconate) soap before surgery. CHG is an antiseptic soap that kills germs and continues to kill germs even after washing.   DO NOT use if you have an allergy to chlorhexidine /CHG or antibacterial soaps. If your skin becomes reddened or irritated, stop using the CHG and notify one of our RNs at [phone]  Please shower with the CHG soap starting 4 days before surgery using the following schedule: START SHOWERS ON  SUNDAY  12-29-2023  Please keep in mind the following:  DO NOT shave, including legs and underarms, starting the day of your first shower.   You may shave your face at any point before/day of surgery.   Place clean sheets on your bed the day you start  using CHG soap. Use a clean washcloth (not used since being washed) for each shower. DO NOT sleep with pets once you start using the CHG.   CHG Shower Instructions:  If you choose to wash your hair and private area, wash first with your normal shampoo/soap.  After you use shampoo/soap, rinse your hair and body thoroughly to remove shampoo/soap residue.  Turn the water OFF and apply about 3 tablespoons (45 ml) of CHG soap to a CLEAN washcloth.  Apply CHG soap ONLY FROM YOUR NECK DOWN TO YOUR TOES (washing for 3-5 minutes)  DO NOT use CHG soap on face, private areas, open wounds, or sores.  Pay special attention to the area where your surgery is being performed.  If you are having back surgery, having someone wash your back for you may be helpful.  Wait 2 minutes after CHG soap is applied, then you may rinse off the CHG soap.  Pat dry with a clean towel  Put on clean clothes/pajamas   If you choose to wear lotion, please use ONLY the CHG-compatible lotions on the back of this paper.     Additional instructions for the day of surgery: DO NOT APPLY any lotions, deodorants, cologne, or perfumes.   Put on clean/comfortable clothes.  Brush your teeth.  Ask your nurse before applying any prescription medications to the skin.      CHG Compatible Lotions   Aveeno Moisturizing lotion  Cetaphil Moisturizing Cream  Cetaphil Moisturizing Lotion  Clairol Herbal Essence Moisturizing Lotion, Dry Skin  Clairol Herbal Essence Moisturizing Lotion, Extra Dry Skin  Clairol Herbal Essence Moisturizing Lotion, Normal Skin  Curel Age Defying Therapeutic Moisturizing Lotion with Alpha Hydroxy  Curel Extreme Care Body Lotion  Curel Soothing Hands Moisturizing Hand Lotion  Curel Therapeutic Moisturizing Cream, Fragrance-Free  Curel Therapeutic Moisturizing Lotion, Fragrance-Free  Curel Therapeutic Moisturizing Lotion, Original Formula  Eucerin Daily Replenishing Lotion  Eucerin Dry Skin Therapy Plus  Alpha Hydroxy Crme  Eucerin Dry Skin Therapy Plus Alpha Hydroxy Lotion  Eucerin Original Crme  Eucerin Original Lotion  Eucerin Plus Crme Eucerin Plus Lotion  Eucerin TriLipid Replenishing Lotion  Keri Anti-Bacterial Hand Lotion  Keri Deep Conditioning Original Lotion Dry Skin Formula Softly Scented  Keri Deep Conditioning Original Lotion, Fragrance Free Sensitive Skin Formula  Keri Lotion Fast Absorbing Fragrance Free Sensitive Skin Formula  Keri Lotion Fast Absorbing Softly Scented Dry Skin Formula  Keri Original Lotion  Keri Skin Renewal Lotion Keri Silky Smooth Lotion  Keri Silky Smooth Sensitive Skin Lotion  Nivea Body Creamy Conditioning Oil  Nivea Body Extra Enriched Lotion  Nivea Body Original Lotion  Nivea Body Sheer Moisturizing Lotion Nivea Crme  Nivea Skin Firming Lotion  NutraDerm 30 Skin Lotion  NutraDerm Skin Lotion  NutraDerm Therapeutic Skin Cream  NutraDerm Therapeutic Skin Lotion  ProShield Protective Hand Cream  Provon moisturizing lotion      Preparing for Total Shoulder Arthroplasty ================================================================= Please follow these instructions carefully, in addition to any other special Bathing information that was explained to you at the Presurgical Appointment:  BENZOYL PEROXIDE 5% GEL: Used to kill bacteria on the skin which could cause an infection at the surgery site.   Please do not use if you have  an allergy to benzoyl peroxide. If your skin becomes reddened/irritated stop using the benzoyl peroxide and inform your Doctor.   Starting two days before surgery, apply as follows:  1. Apply benzoyl peroxide gel in the morning and at night. Apply after taking a shower. If you are not taking a shower, clean entire shoulder front, back, and side, along with the armpit with a clean wet washcloth.  2. Place a quarter-sized dollop of the gel on your SHOULDER and rub in thoroughly, making sure to cover the front,  back, and side of your shoulder, along with the armpit.   2 Days prior to Surgery  TUESDAY 12-31-23 First Application _______ Morning Second Application _______ Night  Day Before Surgery     [facility]  01-01-24 First Application______ Morning  On the night before surgery, wash your entire body (except hair, face and private areas) with CHG Soap. THEN, rub in the LAST application of the Benzoyl Peroxide Gel on your shoulder.   3. On the Morning of Surgery wash your BODY AGAIN with CHG Soap (except hair, face and private areas)  4. DO NOT USE THE BENZOYL PEROXIDE GEL ON THE DAY OF YOUR SURGERY      FAILURE TO FOLLOW THESE INSTRUCTIONS MAY RESULT IN THE CANCELLATION OF YOUR SURGERY  PATIENT SIGNATURE_________________________________  NURSE SIGNATURE__________________________________  ________________________________________________________________________         Nasario Exon    An incentive spirometer is a tool that can help keep your lungs clear and active. This tool measures how well you are filling your lungs with each breath. Taking long deep breaths may help reverse or decrease the chance of developing breathing (pulmonary) problems (especially infection) following: A long period of time when you are unable to move or be active. BEFORE THE PROCEDURE  If the spirometer includes an indicator to show your best effort, your nurse or respiratory therapist will set it to a desired goal. If possible, sit up straight or lean slightly forward. Try not to slouch. Hold the incentive spirometer in an upright position. INSTRUCTIONS FOR USE  Sit on the edge of your bed if possible, or sit up as far as you can in bed or on a chair. Hold the incentive spirometer in an upright position. Breathe out normally. Place the mouthpiece in your mouth and seal your lips tightly around it. Breathe in slowly and as deeply as possible, raising the piston or the ball toward the top of the  column. Hold your breath for 3-5 seconds or for as long as possible. Allow the piston or ball to fall to the bottom of the column. Remove the mouthpiece from your mouth and breathe out normally. Rest for a few seconds and repeat Steps 1 through 7 at least 10 times every 1-2 hours when you are awake. Take your time and take a few normal breaths between deep breaths. The spirometer may include an indicator to show your best effort. Use the indicator as a goal to work toward during each repetition. After each set of 10 deep breaths, practice coughing to be sure your lungs are clear. If you have an incision (the cut made at the time of surgery), support your incision when coughing by placing a pillow or rolled up towels firmly against it. Once you are able to get out of bed, walk around indoors and cough well. You may stop using the incentive spirometer when instructed by your caregiver.  RISKS AND COMPLICATIONS Take your time so you do not get dizzy  or light-headed. If you are in pain, you may need to take or ask for pain medication before doing incentive spirometry. It is harder to take a deep breath if you are having pain. AFTER USE Rest and breathe slowly and easily. It can be helpful to keep track of a log of your progress. Your caregiver can provide you with a simple table to help with this. If you are using the spirometer at home, follow these instructions: SEEK MEDICAL CARE IF:  You are having difficultly using the spirometer. You have trouble using the spirometer as often as instructed. Your pain medication is not giving enough relief while using the spirometer. You develop fever of 100.5 F (38.1 C) or higher.                                                                                                    SEEK IMMEDIATE MEDICAL CARE IF:  You cough up bloody sputum that had not been present before. You develop fever of 102 F (38.9 C) or greater. You develop worsening pain at or near  the incision site. MAKE SURE YOU:  Understand these instructions. Will watch your condition. Will get help right away if you are not doing well or get worse. Document Released: 10/08/2006 Document Revised: 08/20/2011 Document Reviewed: 12/09/2006 [facility] Patient Information 2014 [place], [place].      If you would like to see a video about joint replacement:   [email or web address]

## 2023-12-19 NOTE — Progress Notes (Addendum)
 COVID Vaccine received:  []  No [x]  Yes Date of any COVID positive Test in last 90 days:  none  PCP - Cheryle Frees, MD  at [place], [phone]  clearance scanned to Media Cardiologist - K. [place] Hilty, MD  (ARNETTA 05-18-2022) Pain Mgmt - Charlie Dolores, MD   Chest x-ray -  EKG -[id number]  will repeat  12-20-2023 Stress Test -  ECHO -  Cardiac Cath -  CT Coronary Calcium score: 59 on 05-16-2022  Epic  Pacemaker / ICD device [x]  No []  Yes   Spinal Cord Stimulator:[x]  No []  Yes       History of Sleep Apnea? [x]  No []  Yes   CPAP used?- [x]  No []  Yes    Does the patient monitor blood sugar?   [x]  N/A   []  No []  Yes  Patient has: [x]  NO Hx DM   []  Pre-DM   []  DM1  []   DM2  Blood Thinner / Instructions: none Aspirin Instructions:  none  ERAS Protocol Ordered: []  No  [x]  Yes PRE-SURGERY [x]  ENSURE  []  G2   Patient is to be NPO after: 0430  Dental hx: []  Dentures:  [x]  N/A      []  Bridge or Partial:                   []  Loose or Damaged teeth:   Comments: The patient was given Benzoyl peroxide Gel as ordered. Instruction regarding application starting 2 days prior to surgery was given and patient voiced understanding.   Patient was given the 5 CHG shower / bath instructions for  Reverse Shoulder arthroplasty surgery along with 2 bottles of the CHG soap. Patient will start this on:   12-29-23           Activity level: Able to walk up 2 flights of stairs without becoming significantly short of breath or having chest pain?  []  No   [x]    Yes   Anesthesia review: some memory/ cognitive issues- takes Cogentin , anxiety / depression, CPS- Failed back, CKD3  Patient denies any S&S of respiratory illness or Covid - no shortness of breath, fever, cough or chest pain at PAT appointment.  Patient verbalized understanding and agreement to the Pre-Surgical Instructions that were given to them at this PAT appointment. Patient was also educated of the need to review these PAT instructions again prior to her  surgery.I reviewed the appropriate phone numbers to call if they have any and questions or concerns.

## 2023-12-20 ENCOUNTER — Encounter (HOSPITAL_COMMUNITY)
Admission: RE | Admit: 2023-12-20 | Discharge: 2023-12-20 | Disposition: A | Discharge status: Home / Self Care | Source: Ambulatory Visit | Attending: Orthopedic Surgery | Admitting: Orthopedic Surgery

## 2023-12-20 ENCOUNTER — Other Ambulatory Visit: Payer: Self-pay

## 2023-12-20 ENCOUNTER — Encounter (HOSPITAL_COMMUNITY): Payer: Self-pay

## 2023-12-20 VITALS — BP 114/72 | HR 86 | Temp 97.9°F | Resp 16 | Ht <= 58 in | Wt 131.0 lb

## 2023-12-20 DIAGNOSIS — Z01818 Encounter for other preprocedural examination: Secondary | ICD-10-CM | POA: Diagnosis present

## 2023-12-20 DIAGNOSIS — M75101 Unspecified rotator cuff tear or rupture of right shoulder, not specified as traumatic: Secondary | ICD-10-CM | POA: Diagnosis not present

## 2023-12-20 DIAGNOSIS — Z79891 Long term (current) use of opiate analgesic: Secondary | ICD-10-CM | POA: Insufficient documentation

## 2023-12-20 DIAGNOSIS — M12811 Other specific arthropathies, not elsewhere classified, right shoulder: Secondary | ICD-10-CM | POA: Diagnosis not present

## 2023-12-20 DIAGNOSIS — N289 Disorder of kidney and ureter, unspecified: Secondary | ICD-10-CM | POA: Insufficient documentation

## 2023-12-20 HISTORY — DX: Chronic kidney disease, unspecified: N18.9

## 2023-12-20 HISTORY — DX: Other symptoms and signs involving cognitive functions and awareness: R41.89

## 2023-12-20 HISTORY — DX: Gastro-esophageal reflux disease without esophagitis: K21.9

## 2023-12-20 HISTORY — DX: Headache, unspecified: R51.9

## 2023-12-20 HISTORY — DX: Chronic pain syndrome: G89.4

## 2023-12-20 HISTORY — DX: Unspecified osteoarthritis, unspecified site: M19.90

## 2023-12-20 LAB — COMPREHENSIVE METABOLIC PANEL WITH GFR
ALT: 17 U/L (ref 0–44)
AST: 18 U/L (ref 15–41)
Albumin: 4.1 g/dL (ref 3.5–5.0)
Alkaline Phosphatase: 57 U/L (ref 38–126)
Anion gap: 11 (ref 5–15)
BUN: 18 mg/dL (ref 8–23)
CO2: 26 mmol/L (ref 22–32)
Calcium: 9.6 mg/dL (ref 8.9–10.3)
Chloride: 98 mmol/L (ref 98–111)
Creatinine, Ser: 0.89 mg/dL (ref 0.44–1.00)
GFR, Estimated: >60 mL/min (ref >60)
Glucose, Bld: 105 mg/dL — ABNORMAL HIGH (ref 70–99)
Potassium: 4.3 mmol/L (ref 3.5–5.1)
Sodium: 135 mmol/L (ref 135–145)
Total Bilirubin: 0.6 mg/dL (ref 0.0–1.2)
Total Protein: 6.9 g/dL (ref 6.5–8.1)

## 2023-12-20 LAB — CBC
HCT: 40.2 % (ref 36.0–46.0)
Hemoglobin: 13.3 g/dL (ref 12.0–15.0)
MCH: 29.6 pg (ref 26.0–34.0)
MCHC: 33.1 g/dL (ref 30.0–36.0)
MCV: 89.3 fL (ref 80.0–100.0)
Platelets: 168 K/uL (ref 150–400)
RBC: 4.5 MIL/uL (ref 3.87–5.11)
RDW: 12.7 % (ref 11.5–15.5)
WBC: 5.2 K/uL (ref 4.0–10.5)
nRBC: 0 % (ref 0.0–0.2)

## 2023-12-20 LAB — SURGICAL PCR SCREEN
MRSA, PCR: NEGATIVE
Staphylococcus aureus: NEGATIVE

## 2024-01-02 ENCOUNTER — Ambulatory Visit (HOSPITAL_COMMUNITY)
Admission: RE | Admit: 2024-01-02 | Discharge: 2024-01-02 | Disposition: A | Discharge status: Home / Self Care | Source: Ambulatory Visit | Attending: Orthopedic Surgery | Admitting: Orthopedic Surgery

## 2024-01-02 ENCOUNTER — Encounter (HOSPITAL_COMMUNITY): Payer: Self-pay | Admitting: Orthopedic Surgery

## 2024-01-02 ENCOUNTER — Other Ambulatory Visit: Payer: Self-pay

## 2024-01-02 ENCOUNTER — Encounter (HOSPITAL_COMMUNITY)
Admission: RE | Disposition: A | Discharge status: Home / Self Care | Payer: Self-pay | Source: Ambulatory Visit | Attending: Orthopedic Surgery

## 2024-01-02 ENCOUNTER — Ambulatory Visit (HOSPITAL_BASED_OUTPATIENT_CLINIC_OR_DEPARTMENT_OTHER): Admitting: Anesthesiology

## 2024-01-02 ENCOUNTER — Encounter (HOSPITAL_COMMUNITY): Admitting: Anesthesiology

## 2024-01-02 DIAGNOSIS — F418 Other specified anxiety disorders: Secondary | ICD-10-CM | POA: Diagnosis not present

## 2024-01-02 DIAGNOSIS — G8929 Other chronic pain: Secondary | ICD-10-CM | POA: Diagnosis not present

## 2024-01-02 DIAGNOSIS — K219 Gastro-esophageal reflux disease without esophagitis: Secondary | ICD-10-CM | POA: Diagnosis not present

## 2024-01-02 DIAGNOSIS — M75101 Unspecified rotator cuff tear or rupture of right shoulder, not specified as traumatic: Secondary | ICD-10-CM | POA: Insufficient documentation

## 2024-01-02 DIAGNOSIS — F419 Anxiety disorder, unspecified: Secondary | ICD-10-CM | POA: Insufficient documentation

## 2024-01-02 DIAGNOSIS — Z79899 Other long term (current) drug therapy: Secondary | ICD-10-CM | POA: Insufficient documentation

## 2024-01-02 DIAGNOSIS — Z87891 Personal history of nicotine dependence: Secondary | ICD-10-CM | POA: Diagnosis not present

## 2024-01-02 DIAGNOSIS — F32A Depression, unspecified: Secondary | ICD-10-CM | POA: Diagnosis not present

## 2024-01-02 DIAGNOSIS — M12811 Other specific arthropathies, not elsewhere classified, right shoulder: Secondary | ICD-10-CM | POA: Diagnosis not present

## 2024-01-02 DIAGNOSIS — N189 Chronic kidney disease, unspecified: Secondary | ICD-10-CM

## 2024-01-02 HISTORY — PX: REVERSE SHOULDER ARTHROPLASTY: SHX5054

## 2024-01-02 SURGERY — ARTHROPLASTY, SHOULDER, TOTAL, REVERSE
Anesthesia: Regional | Site: Shoulder | Laterality: Right

## 2024-01-02 MED ORDER — ONDANSETRON HCL 4 MG/2ML IJ SOLN
INTRAMUSCULAR | Status: AC
  Filled 2024-01-02: qty 2

## 2024-01-02 MED ORDER — LIDOCAINE 2% (20 MG/ML) 5 ML SYRINGE
INTRAMUSCULAR | Status: DC | PRN
Start: 2024-01-02 — End: 2024-01-02
  Administered 2024-01-02: 40 mg via INTRAVENOUS

## 2024-01-02 MED ORDER — MIDAZOLAM HCL 5 MG/5ML IJ SOLN
INTRAMUSCULAR | Status: DC | PRN
  Administered 2024-01-02 (×2): 1 mg via INTRAVENOUS

## 2024-01-02 MED ORDER — ONDANSETRON HCL 4 MG/2ML IJ SOLN
INTRAMUSCULAR | Status: DC | PRN
  Administered 2024-01-02: 4 mg via INTRAVENOUS

## 2024-01-02 MED ORDER — DEXAMETHASONE SODIUM PHOSPHATE 10 MG/ML IJ SOLN
INTRAMUSCULAR | Status: DC | PRN
  Administered 2024-01-02: 5 mg via INTRAVENOUS

## 2024-01-02 MED ORDER — CEFAZOLIN SODIUM-DEXTROSE 2-4 GM/100ML-% IV SOLN
2.0000 g | INTRAVENOUS | Status: AC
  Administered 2024-01-02: 2 g via INTRAVENOUS
  Filled 2024-01-02: qty 100

## 2024-01-02 MED ORDER — 0.9 % SODIUM CHLORIDE (POUR BTL) OPTIME
TOPICAL | Status: DC | PRN
  Administered 2024-01-02: 1000 mL

## 2024-01-02 MED ORDER — HYDROMORPHONE HCL 1 MG/ML IJ SOLN
0.2500 mg | INTRAMUSCULAR | Status: DC | PRN
  Administered 2024-01-02 (×2): 0.5 mg via INTRAVENOUS

## 2024-01-02 MED ORDER — EPHEDRINE SULFATE (PRESSORS) 50 MG/ML IJ SOLN: INTRAMUSCULAR | Status: DC | PRN

## 2024-01-02 MED ORDER — CHLORHEXIDINE GLUCONATE 0.12 % MT SOLN
15.0000 mL | Freq: Once | OROMUCOSAL | Status: AC
  Administered 2024-01-02: 15 mL via OROMUCOSAL

## 2024-01-02 MED ORDER — CYCLOBENZAPRINE HCL 10 MG PO TABS: 10.0000 mg | ORAL_TABLET | Freq: Three times a day (TID) | ORAL | 1 refills | Status: AC | PRN

## 2024-01-02 MED ORDER — PHENYLEPHRINE HCL-NACL 20-0.9 MG/250ML-% IV SOLN
INTRAVENOUS | Status: AC
  Filled 2024-01-02: qty 250

## 2024-01-02 MED ORDER — ORAL CARE MOUTH RINSE: 15.0000 mL | Freq: Once | OROMUCOSAL | Status: AC

## 2024-01-02 MED ORDER — LIDOCAINE HCL (PF) 2 % IJ SOLN
INTRAMUSCULAR | Status: DC | PRN
Start: 2024-01-02 — End: 2024-01-02

## 2024-01-02 MED ORDER — EPHEDRINE 5 MG/ML INJ
INTRAVENOUS | Status: AC
  Filled 2024-01-02: qty 5

## 2024-01-02 MED ORDER — AMISULPRIDE (ANTIEMETIC) 5 MG/2ML IV SOLN: 10.0000 mg | Freq: Once | INTRAVENOUS | Status: DC | PRN

## 2024-01-02 MED ORDER — METHOCARBAMOL 1000 MG/10ML IJ SOLN: 500.0000 mg | Freq: Once | INTRAMUSCULAR | Status: DC

## 2024-01-02 MED ORDER — PHENYLEPHRINE 80 MCG/ML (10ML) SYRINGE FOR IV PUSH (FOR BLOOD PRESSURE SUPPORT)
PREFILLED_SYRINGE | INTRAVENOUS | Status: DC | PRN
  Administered 2024-01-02: 240 ug via INTRAVENOUS
  Administered 2024-01-02: 80 ug via INTRAVENOUS

## 2024-01-02 MED ORDER — FENTANYL CITRATE (PF) 100 MCG/2ML IJ SOLN
INTRAMUSCULAR | Status: DC | PRN
  Administered 2024-01-02 (×3): 50 ug via INTRAVENOUS

## 2024-01-02 MED ORDER — ROCURONIUM BROMIDE 10 MG/ML (PF) SYRINGE
PREFILLED_SYRINGE | INTRAVENOUS | Status: DC | PRN
  Administered 2024-01-02: 20 mg via INTRAVENOUS
  Administered 2024-01-02: 40 mg via INTRAVENOUS

## 2024-01-02 MED ORDER — DEXAMETHASONE SODIUM PHOSPHATE 10 MG/ML IJ SOLN
INTRAMUSCULAR | Status: AC
  Filled 2024-01-02: qty 1

## 2024-01-02 MED ORDER — PROPOFOL 10 MG/ML IV BOLUS
INTRAVENOUS | Status: AC
  Filled 2024-01-02: qty 20

## 2024-01-02 MED ORDER — HYDROMORPHONE HCL 4 MG PO TABS: 2.0000 mg | ORAL_TABLET | ORAL | 0 refills | Status: AC | PRN

## 2024-01-02 MED ORDER — ROCURONIUM BROMIDE 10 MG/ML (PF) SYRINGE
PREFILLED_SYRINGE | INTRAVENOUS | Status: AC
  Filled 2024-01-02: qty 10

## 2024-01-02 MED ORDER — ONDANSETRON HCL 4 MG PO TABS: 4.0000 mg | ORAL_TABLET | Freq: Three times a day (TID) | ORAL | 0 refills | Status: AC | PRN

## 2024-01-02 MED ORDER — HYDROMORPHONE HCL 1 MG/ML IJ SOLN
INTRAMUSCULAR | Status: AC
  Filled 2024-01-02: qty 1

## 2024-01-02 MED ORDER — ONDANSETRON HCL 4 MG/2ML IJ SOLN: 4.0000 mg | Freq: Once | INTRAMUSCULAR | Status: DC | PRN

## 2024-01-02 MED ORDER — EPHEDRINE SULFATE-NACL 50-0.9 MG/10ML-% IV SOSY
PREFILLED_SYRINGE | INTRAVENOUS | Status: DC | PRN
Start: 2024-01-02 — End: 2024-01-02
  Administered 2024-01-02: 5 mg via INTRAVENOUS

## 2024-01-02 MED ORDER — GLYCOPYRROLATE 0.2 MG/ML IJ SOLN
INTRAMUSCULAR | Status: DC | PRN
  Administered 2024-01-02: .2 mg via INTRAVENOUS

## 2024-01-02 MED ORDER — VANCOMYCIN HCL 1000 MG IV SOLR
INTRAVENOUS | Status: DC | PRN
  Administered 2024-01-02: 1000 mg via TOPICAL

## 2024-01-02 MED ORDER — FENTANYL CITRATE (PF) 100 MCG/2ML IJ SOLN
INTRAMUSCULAR | Status: AC
Start: 2024-01-02 — End: 2024-01-02
  Filled 2024-01-02: qty 2

## 2024-01-02 MED ORDER — GLYCOPYRROLATE 0.2 MG/ML IJ SOLN
INTRAMUSCULAR | Status: AC
  Filled 2024-01-02: qty 1

## 2024-01-02 MED ORDER — TRANEXAMIC ACID-NACL 1000-0.7 MG/100ML-% IV SOLN
1000.0000 mg | INTRAVENOUS | Status: AC
  Administered 2024-01-02: 1000 mg via INTRAVENOUS
  Filled 2024-01-02: qty 100

## 2024-01-02 MED ORDER — OXYCODONE HCL 5 MG PO TABS: 5.0000 mg | ORAL_TABLET | Freq: Once | ORAL | Status: DC | PRN

## 2024-01-02 MED ORDER — TRANEXAMIC ACID 1000 MG/10ML IV SOLN: 1000.0000 mg | INTRAVENOUS | Status: DC

## 2024-01-02 MED ORDER — VANCOMYCIN HCL 1000 MG IV SOLR
INTRAVENOUS | Status: AC
Start: 2024-01-02 — End: 2024-01-02
  Filled 2024-01-02: qty 20

## 2024-01-02 MED ORDER — PROPOFOL 10 MG/ML IV BOLUS
INTRAVENOUS | Status: DC | PRN
  Administered 2024-01-02: 100 mg via INTRAVENOUS

## 2024-01-02 MED ORDER — MIDAZOLAM HCL 2 MG/2ML IJ SOLN
INTRAMUSCULAR | Status: AC
  Filled 2024-01-02: qty 2

## 2024-01-02 MED ORDER — SUGAMMADEX SODIUM 200 MG/2ML IV SOLN
INTRAVENOUS | Status: DC | PRN
  Administered 2024-01-02: 200 mg via INTRAVENOUS

## 2024-01-02 MED ORDER — OXYCODONE HCL 5 MG/5ML PO SOLN: 5.0000 mg | Freq: Once | ORAL | Status: DC | PRN

## 2024-01-02 MED ORDER — PHENYLEPHRINE HCL-NACL 20-0.9 MG/250ML-% IV SOLN
INTRAVENOUS | Status: DC | PRN
  Administered 2024-01-02: 40 ug/min via INTRAVENOUS

## 2024-01-02 MED ORDER — DICLOFENAC SODIUM 75 MG PO TBEC: 75.0000 mg | DELAYED_RELEASE_TABLET | Freq: Two times a day (BID) | ORAL | 1 refills | Status: AC

## 2024-01-02 MED ORDER — LACTATED RINGERS IV SOLN: INTRAVENOUS | Status: DC

## 2024-01-02 MED ORDER — ACETAMINOPHEN 500 MG PO TABS
1000.0000 mg | ORAL_TABLET | Freq: Once | ORAL | Status: AC
  Administered 2024-01-02: 1000 mg via ORAL
  Filled 2024-01-02: qty 2

## 2024-01-02 SURGICAL SUPPLY — 59 items
BAG COUNTER SPONGE SURGICOUNT (BAG) IMPLANT
BAG ZIPLOCK 12X15 (MISCELLANEOUS) ×1 IMPLANT
BIT DRILL AR 3 NS (BIT) IMPLANT
BLADE SAW SGTL 83.5X18.5 (BLADE) ×1 IMPLANT
BNDG COHESIVE 4X5 TAN STRL LF (GAUZE/BANDAGES/DRESSINGS) ×1 IMPLANT
COOLER ICEMAN CLASSIC (MISCELLANEOUS) ×1 IMPLANT
COVER BACK TABLE 60X90IN (DRAPES) ×1 IMPLANT
COVER SURGICAL LIGHT HANDLE (MISCELLANEOUS) ×1 IMPLANT
CUP SUT UNIV REV NEUTRAL 33 (Cup) IMPLANT
DRAPE SHEET LG 3/4 BI-LAMINATE (DRAPES) ×1 IMPLANT
DRAPE SURG 17X11 SM STRL (DRAPES) ×1 IMPLANT
DRAPE SURG ORHT 6 SPLT 77X108 (DRAPES) ×2 IMPLANT
DRAPE TOP 10253 STERILE (DRAPES) ×1 IMPLANT
DRAPE U-SHAPE 47X51 STRL (DRAPES) ×1 IMPLANT
DRESSING AQUACEL AG SP 3.5X6 (GAUZE/BANDAGES/DRESSINGS) ×1 IMPLANT
DRSG AQUACEL AG ADV 3.5X 6 (GAUZE/BANDAGES/DRESSINGS) IMPLANT
DURAPREP 26ML APPLICATOR (WOUND CARE) ×1 IMPLANT
ELECT BLADE TIP CTD 4 INCH (ELECTRODE) ×1 IMPLANT
ELECT PENCIL ROCKER SW 15FT (MISCELLANEOUS) ×1 IMPLANT
ELECT REM PT RETURN 15FT ADLT (MISCELLANEOUS) ×1 IMPLANT
FACESHIELD WRAPAROUND (MASK) ×5 IMPLANT
FACESHIELD WRAPAROUND OR TEAM (MASK) ×5 IMPLANT
GLENOID UNI REV MOD 24 +2 LAT (Joint) IMPLANT
GLENOSPHERE 33+4 LAT/24 UNI RV (Joint) IMPLANT
GLOVE BIO SURGEON STRL SZ7.5 (GLOVE) ×1 IMPLANT
GLOVE BIO SURGEON STRL SZ8 (GLOVE) ×1 IMPLANT
GLOVE SS BIOGEL STRL SZ 7 (GLOVE) ×1 IMPLANT
GLOVE SS BIOGEL STRL SZ 7.5 (GLOVE) ×1 IMPLANT
GOWN STRL SURGICAL XL XLNG (GOWN DISPOSABLE) ×2 IMPLANT
KIT BASIN OR (CUSTOM PROCEDURE TRAY) ×1 IMPLANT
KIT TURNOVER KIT A (KITS) ×1 IMPLANT
LINER GLENOID HUM REV 33 +3 (Liner) IMPLANT
MANIFOLD NEPTUNE II (INSTRUMENTS) ×1 IMPLANT
NDL TAPERED W/ NITINOL LOOP (MISCELLANEOUS) ×1 IMPLANT
NEEDLE TAPERED W/ NITINOL LOOP (MISCELLANEOUS) ×1 IMPLANT
NS IRRIG 1000ML POUR BTL (IV SOLUTION) ×1 IMPLANT
PACK SHOULDER (CUSTOM PROCEDURE TRAY) ×1 IMPLANT
PAD ARMBOARD POSITIONER FOAM (MISCELLANEOUS) ×1 IMPLANT
PAD COLD SHLDR WRAP-ON (PAD) ×1 IMPLANT
PIN NITINOL TARGETER 2.8 (PIN) IMPLANT
PIN SET MODULAR GLENOID SYSTEM (PIN) IMPLANT
RESTRAINT HEAD UNIVERSAL NS (MISCELLANEOUS) ×1 IMPLANT
SCREW CENTRAL MOD 30MM (Screw) IMPLANT
SCREW PERI LOCK 5.5X16 (Screw) IMPLANT
SCREW PERI LOCK 5.5X32 (Screw) IMPLANT
SCREW PERIPHERAL 5.5X20 LOCK (Screw) IMPLANT
SLING ARM FOAM STRAP LRG (SOFTGOODS) IMPLANT
SLING ARM FOAM STRAP MED (SOFTGOODS) IMPLANT
STEM HUM UNIV REV APEX SZ 6 (Stem) IMPLANT
STRIP CLOSURE SKIN 1/2X4 (GAUZE/BANDAGES/DRESSINGS) ×1 IMPLANT
SUT MNCRL AB 3-0 PS2 18 (SUTURE) ×1 IMPLANT
SUT MON AB 2-0 CT1 36 (SUTURE) ×1 IMPLANT
SUT VIC AB 1 CT1 36 (SUTURE) ×1 IMPLANT
SUTURE TAPE 1.3 40 TPR END (SUTURE) ×2 IMPLANT
TOWEL GREEN STERILE FF (TOWEL DISPOSABLE) ×1 IMPLANT
TOWEL OR 17X26 10 PK STRL BLUE (TOWEL DISPOSABLE) ×1 IMPLANT
TUBE SUCTION HIGH CAP CLEAR NV (SUCTIONS) ×1 IMPLANT
TUBING CONNECTING 10 (TUBING) ×1 IMPLANT
WATER STERILE IRR 1000ML POUR (IV SOLUTION) ×2 IMPLANT

## 2024-01-02 NOTE — H&P (Signed)
 Kristen Wilcox    Chief Complaint: Right rotator cuff arthropathy HPI: The patient is a 68 y.o. female with chronic right shoulder pain related to advanced osteoarthritis as well as remote history of fall with non-/minimally displaced greater tuberosity fracture.  Since her fall over a year ago her symptoms have progressively deteriorated.  MRI scan confirms advanced osteoarthritis and marked rotator cuff degeneration.  She is brought to the operating today for planned right shoulder reverse arthroplasty  Past Medical History:  Diagnosis Date   Anxiety    Arthritis    Cancer (HCC)    skin cancer   Chronic kidney disease    Chronic pain syndrome    Failed back surgery, herniated disk   Cognitive changes    d/t medications per patient, Take Cogentin    Depression    GERD (gastroesophageal reflux disease)    Headache    migraines   Lumbar herniated disc     l5 to s1      Past Surgical History:  Procedure Laterality Date   c section     x2   COLONOSCOPY     x2   LUMBAR LAMINECTOMY/DECOMPRESSION MICRODISCECTOMY Left 08/15/2016   Procedure: Microlumbar decompression L4-L5, L5-S1 left;  Surgeon: Reyes Billing, MD;  Location: [facility];  Service: Orthopedics;  Laterality: Left;  90 mins   TUBAL LIGATION      Family History  Problem Relation Age of Onset   Hypertension Mother    Dementia Mother    Hypertension Father    Other Father        hx of carcinoma   Bladder Cancer Father     Social History:  reports that she has quit smoking. Her smoking use included cigarettes. She has never used smokeless tobacco. She reports current alcohol use. She reports that she does not use drugs.  BMI: Estimated body mass index is 27.66 kg/m as calculated from the following:   Height as of this encounter: 4' 9.5 (1.461 m).   Weight as of this encounter: 59 kg.  Lab Results  Component Value Date   ALBUMIN 4.1 12/20/2023   Diabetes: Patient does not have a diagnosis of diabetes.      Smoking Status:       Medications Prior to Admission  Medication Sig Dispense Refill   ALPRAZolam  (XANAX ) 0.5 MG tablet Take 0.5 mg by mouth 2 (two) times daily as needed for anxiety.      Bempedoic Acid-Ezetimibe  (NEXLIZET ) 180-10 MG TABS Take 1 tablet by mouth daily. (Patient taking differently: Take 1 tablet by mouth at bedtime.) 30 tablet 11   benztropine  (COGENTIN ) 2 MG tablet Take 2 mg by mouth 2 (two) times daily.     Calcium Carbonate (CALCIUM 500 PO) Take 500 mg by mouth daily. Chewable     Cholecalciferol (VITAMIN D3) 50 MCG (2000 UT) capsule Take 2,000 Units by mouth at bedtime.     diclofenac  (VOLTAREN ) 75 MG EC tablet Take 1 tablet (75 mg total) by mouth 2 (two) times daily. Resume 5 days post-op as needed     famotidine-calcium carbonate-magnesium  hydroxide (PEPCID COMPLETE) 10-800-165 MG chewable tablet Chew 1-2 tablets by mouth daily as needed (heartburn).     gabapentin  (NEURONTIN ) 400 MG capsule Take 400 mg by mouth 2 (two) times daily.     hyoscyamine (OSCIMIN) 0.125 MG tablet Take 0.125 mg by mouth every 4 (four) hours as needed (abdominal pain).     oxyCODONE -acetaminophen  (PERCOCET) 10-325 MG tablet Take 1 tablet by mouth  every 6 (six) hours as needed for pain.     polyethylene glycol (MIRALAX  / GLYCOLAX ) 17 g packet Take 17 g by mouth daily as needed for moderate constipation.     Probiotic Product (ALIGN PO) Take 1 capsule by mouth daily.     Rhubarb (ESTROVEN COMPLETE PO) Take 1 capsule by mouth daily.     risperiDONE (RISPERDAL) 3 MG tablet Take 1.5-3 mg by mouth See admin instructions. 3 mg in the morning, 1.5 mg in the evening       Physical Exam: Right shoulder demonstrates painful and profoundly restricted motion as noted at her recent office visits.  She has globally decreased strength to manual muscle testing.  She is otherwise neurovascularly intact in the right upper extremity.  Imaging studies confirm advanced osteoarthritis.  MRI scan reveals edema  within the greater tuberosity related to the previous fracture.  Severe diffuse rotator cuff degeneration noted as well.  Vitals  Temp:  [98.1 F (36.7 C)] 98.1 F (36.7 C) (07/24 0530) Pulse Rate:  [90] 90 (07/24 0530) Resp:  [16] 16 (07/24 0530) BP: (112)/(69) 112/69 (07/24 0530) SpO2:  [98 %] 98 % (07/24 0530) Weight:  [59 kg] 59 kg (07/24 0550)  Assessment/Plan  Impression: Right rotator cuff arthropathy  Plan of Action: Procedure(s): ARTHROPLASTY, SHOULDER, TOTAL, REVERSE  Kristen Wilcox 01/02/2024, 6:21 AM Contact # [phone]

## 2024-01-02 NOTE — Anesthesia Preprocedure Evaluation (Addendum)
 Anesthesia Evaluation  Patient identified by MRN, date of birth, ID band Patient awake    Reviewed: Allergy & Precautions, H&P , NPO status , Patient's Chart, lab work & pertinent test results  Airway Mallampati: IV  TM Distance: >3 FB Neck ROM: Full    Dental  (+) Teeth Intact, Dental Advisory Given   Pulmonary former smoker   Pulmonary exam normal breath sounds clear to auscultation       Cardiovascular negative cardio ROS Normal cardiovascular exam Rhythm:Regular Rate:Normal     Neuro/Psych  Headaches PSYCHIATRIC DISORDERS Anxiety Depression       GI/Hepatic Neg liver ROS,GERD  Controlled and Medicated,,  Endo/Other  negative endocrine ROS    Renal/GU negative Renal ROS  negative genitourinary   Musculoskeletal  (+) Arthritis , Osteoarthritis,  Chronic pain: percocet 10/325, gabapentin  400, diclofenac    Abdominal   Peds negative pediatric ROS (+)  Hematology negative hematology ROS (+)   Anesthesia Other Findings   Reproductive/Obstetrics negative OB ROS                              Anesthesia Physical Anesthesia Plan  ASA: 2  Anesthesia Plan: General and Regional   Post-op Pain Management: Tylenol  PO (pre-op)* and Regional block*   Induction: Intravenous  PONV Risk Score and Plan: 3 and Ondansetron , Dexamethasone , Midazolam  and Treatment may vary due to age or medical condition  Airway Management Planned: Oral ETT  Additional Equipment: None  Intra-op Plan:   Post-operative Plan: Extubation in OR  Informed Consent: I have reviewed the patients History and Physical, chart, labs and discussed the procedure including the risks, benefits and alternatives for the proposed anesthesia with the patient or authorized representative who has indicated his/her understanding and acceptance.     Dental advisory given  Plan Discussed with: CRNA  Anesthesia Plan Comments: (Last  intubation note: Laryngoscope Size: Mac and 3 Grade View: Grade I Tube type: Oral Tube size: 7.0 mm Number of attempts: 1 )         Anesthesia Quick Evaluation

## 2024-01-02 NOTE — Evaluation (Signed)
 Occupational Therapy Evaluation Patient Details Name: Kristen Wilcox MRN: [id number] DOB: 06-19-1955 Today's Date: 01/02/2024   History of Present Illness   Kristen Wilcox is a 68 yr old female who is s/p a R shoulder reverse arthrolasty on 01-02-24, due to rotator cuff arthropathy.     Clinical Impressions Pt is s/p shoulder replacement of right dominant upper extremity on 01-02-24. Therapist provided education and instruction to the patient and and her spouse with regards to ROM/exercise protocol, post-op precautions, UE and sling positioning, donning upper extremity clothing, recommendations for bathing while maintaining shoulder precautions, how to use ice machine, use of ice for pain and edema management, NWB status, sling wear schedule, and correctly donning/doffing sling. Patient and spouse verbalized and demonstrated understanding as needed. Patient needed assistance to donn shirt, underwear, pants, socks and shoes with instruction on compensatory strategies to perform ADLs. Patient to follow up with MD for further therapy needs.       If plan is discharge home, recommend the following:   A little help with bathing/dressing/bathroom;Help with stairs or ramp for entrance;Assist for transportation;Assistance with cooking/housework     Functional Status Assessment   Patient has had a recent decline in their functional status and demonstrates the ability to make significant improvements in function in a reasonable and predictable amount of time.     Equipment Recommendations   None recommended by OT     Recommendations for Other Services         Precautions/Restrictions   Precautions Precautions: Shoulder Type of Shoulder Precautions: If sitting in controlled environment, ok to come out of sling to give neck a break. Please sleep in it to protect until follow up in office.     OK to use operative arm for feeding, hygiene and ADLs.   Ok to instruct Pendulums and lap  slides as exercises. Ok to use operative arm within the following parameters for ADL purposes     New ROM (1/81)   Ok for PROM, AAROM, AROM within pain tolerance and within the following ROM   ER 20   ABD 45   FE 60 Shoulder Interventions: Shoulder sling/immobilizer Precaution Booklet Issued: Yes (comment) Required Braces or Orthoses: Sling Restrictions Weight Bearing Restrictions Per Provider Order: Yes RUE Weight Bearing Per Provider Order: Non weight bearing Other Position/Activity Restrictions: okay to perform elbow, wrist, and hand ROM, sling to worn at all times except ADLs/exercise     Mobility Bed Mobility    General bed mobility comments: pt was seated in chair    Transfers Overall transfer level: Needs assistance Equipment used: None Transfers: Sit to/from Stand Sit to Stand: Contact guard assist                  Balance Overall balance assessment: No apparent balance deficits (not formally assessed)            ADL either performed or assessed with clinical judgement   ADL Overall ADL's : Needs assistance/impaired                 Upper Body Dressing : Maximal assistance;Cueing for sequencing;Sitting Upper Body Dressing Details (indicate cue type and reason): to donn overhead shirt and sling in sitting Lower Body Dressing: Minimal assistance;Sitting/lateral leans Lower Body Dressing Details (indicate cue type and reason): to donn underwear, pants, and shoes                      Pertinent Vitals/Pain Pain Assessment Pain Assessment: No/denies  pain     Extremity/Trunk Assessment Upper Extremity Assessment Upper Extremity Assessment: Right hand dominant   Lower Extremity Assessment Lower Extremity Assessment: Overall WFL for tasks assessed       Communication Communication Communication: No apparent difficulties   Cognition Arousal:  (mildly lethargic) Behavior During Therapy: WFL for tasks assessed/performed Cognition: No  apparent impairments             OT - Cognition Comments: Oriented x4                 Following commands: Intact             Shoulder Instructions Shoulder Instructions Donning/doffing shirt without moving shoulder: Maximal assistance Method for sponge bathing under operated UE:  (Caregiver verbalized understanding) Donning/doffing sling/immobilizer: Maximal assistance Correct positioning of sling/immobilizer: Maximal assistance Pendulum exercises (written home exercise program):  (Caregiver verbalized understanding) ROM for elbow, wrist and digits of operated UE:  (Caregiver verbalized understanding) Sling wearing schedule (on at all times/off for ADL's):  (Caregiver verbalized understanding) Proper positioning of operated UE when showering:  (Caregiver verbalized understanding) Dressing change:  (Caregiver verbalized understanding) Positioning of UE while sleeping:  (Caregiver verbalized understanding)    Home Living Family/patient expects to be discharged to:: Private residence Living Arrangements: Spouse/significant other Available Help at Discharge: Family Type of Home: House Home Access: Stairs to enter Secretary/administrator of Steps: 4 Entrance Stairs-Rails: Left Home Layout: Two level Alternate Level Stairs-Number of Steps: bedroom and full bathroom are on upper level of home; ~7 steps to ascend   [employer] Shower/Tub: Tub/shower unit;Walk-in shower         Home Equipment: Tub bench;Cane - single point          Prior Functioning/Environment Prior Level of Function : Independent/Modified Independent;Driving             Mobility Comments: Independent with ambulation. ADLs Comments: She was independent with ADLs and shared cooking and cleaning with her spouse.    OT Problem List: Decreased range of motion;Decreased strength;Decreased knowledge of precautions;Impaired UE functional use   OT Treatment/Interventions:        OT Goals(Current  goals can be found in the care plan section)   Acute Rehab OT Goals OT Goal Formulation: All assessment and education complete, DC therapy    AM-PAC OT 6 Clicks Daily Activity     Outcome Measure Help from another person eating meals?: None Help from another person taking care of personal grooming?: A Little Help from another person toileting, which includes using toliet, bedpan, or urinal?: A Little Help from another person bathing (including washing, rinsing, drying)?: A Little Help from another person to put on and taking off regular upper body clothing?: A Lot Help from another person to put on and taking off regular lower body clothing?: A Little 6 Click Score: 18   End of Session Equipment Utilized During Treatment: Other (comment) (N/A) Nurse Communication: Other (comment) (shoulder education completed)  Activity Tolerance: Patient tolerated treatment well Patient left: in chair;with call bell/phone within reach;with nursing/sitter in room  OT Visit Diagnosis: Muscle weakness (generalized) (M62.81)                Time: 8845-8774 OT Time Calculation (min): 31 min Charges:  OT General Charges $OT Visit: 1 Visit OT Evaluation $OT Eval Moderate Complexity: 1 Mod OT Treatments $Self Care/Home Management : 8-22 mins    Kristen Wilcox, OTR/L 01/02/2024, 8:50 PM

## 2024-01-02 NOTE — Transfer of Care (Signed)
 Immediate Anesthesia Transfer of Care Note  Patient: Kristen Wilcox  Procedure(s) Performed: ARTHROPLASTY, SHOULDER, TOTAL, REVERSE (Right: Shoulder)  Patient Location: PACU  Anesthesia Type:General and Regional  Level of Consciousness: awake, alert , oriented, and patient cooperative  Airway & Oxygen Therapy: Patient Spontanous Breathing and Patient connected to face mask oxygen  Post-op Assessment: Report given to RN and Post -op Vital signs reviewed and stable  Post vital signs: Reviewed and stable  Last Vitals:  Vitals Value Taken Time  BP 123/71 01/02/24 09:45  Temp    Pulse 69 01/02/24 09:48  Resp 10 01/02/24 09:48  SpO2 100 % 01/02/24 09:48  Vitals shown include unfiled device data.  Last Pain:  Vitals:   01/02/24 0550  TempSrc:   PainSc: 0-No pain         Complications: No notable events documented.

## 2024-01-02 NOTE — Op Note (Signed)
 01/02/2024  9:28 AM  PATIENT:   Kristen Wilcox  68 y.o. female  PRE-OPERATIVE DIAGNOSIS:  Right rotator cuff arthropathy  POST-OPERATIVE DIAGNOSIS: Same  PROCEDURE: Right shoulder reverse arthroplasty utilizing a press-fit short stem size 6 humeral implant, neutral metathesis, +3 polyethylene insert, 33/+4 glenosphere and a small/+2 baseplate  SURGEON:  Allayna Erlich, Franky BATTLE M.D.  ASSISTANTS: Randine Ricks, PA-C  Randine Ricks, PA-C was utilized as an Geophysicist/field seismologist throughout this case, essential for help with positioning the patient, positioning extremity, tissue manipulation, implantation of the prosthesis, suture management, wound closure, and intraoperative decision-making.  ANESTHESIA:   General Endotracheal and interscalene block with Exparel   EBL: 100 cc  SPECIMEN: None  Drains: None   PATIENT DISPOSITION:  PACU - hemodynamically stable.    PLAN OF CARE: Discharge to home after PACU  Brief history:  Patient is a 68 year old female with chronic and progressive increasing right shoulder pain related to severe glenohumeral arthritis and rotator cuff degeneration and dysfunction.  Due to her increasing pain and failure to respond to prolonged attempts at conservative management, she is brought to the operating this time for planned right shoulder reverse arthroplasty.  Preoperatively, I counseled the patient regarding treatment options and risks versus benefits thereof.  Possible surgical complications were all reviewed including potential for bleeding, infection, neurovascular injury, persistent pain, loss of motion, anesthetic complication, failure of the implant, and possible need for additional surgery. They understand and accept and agrees with our planned procedure.   Procedure detail:  After undergoing routine preop evaluation the patient received prophylactic antibiotics and interscalene block with Exparel  was established in the holding area by the anesthesia department.   Subsequently placed supine on the operating table and underwent the smooth induction of a general endotracheal anesthesia.  Placed into the beachchair position and appropriately padded and protected.  The right shoulder region was sterilely prepped and draped in standard fashion.  Timeout was called.  A deltopectoral approach to the right shoulder was made sharply with skin flaps elevated and electrocautery was used for hemostasis.  The deltopectoral interval was then divided from proximal to distal with the vein taken laterally.  The conjoined tendon was mobilized and retracted medially and adhesions were divided beneath the deltoid.  The long head biceps tendon was then tenodesed at the upper border the pectoralis major tendon with the proximal segment unroofed and excised.  The superior rotator cuff was then split from the apex of the bicipital groove to the base of the coracoid and the subscap was separated from the lesser tuberosity using electrocautery and tagged with a pair of grasping suture tape sutures.  Capsular attachments were then divided from the anterior and inferior margins of the humeral neck and the humeral head was then delivered through the wound.  An extra medullary guide was then used to outline the proposed humeral head resection which we performed with an oscillating saw at approximately 20 degrees of retroversion.  A metal Was placed with the cut proximal humeral surface after the marginal osteophytes were removed with a rondure.  The glenoid was then exposed and a circumferential labral resection was performed.  A guidepin was then directed into the center of the glenoid and the glenoid was then reamed with the central followed by the peripheral reamer to a stable subchondral bony bed in preparation completed with a drill and tapped for a 30 mm lag screw.  Our baseplate was then assembled and inserted with vancomycin  powder applied to the threads of the lag  screw and excellent fixation was  achieved.  The peripheral locking screws were all then placed using standard technique with excellent fixation.  A 33/+4 glenosphere was then impacted onto the baseplate and a central locking screw was placed.  We returned our attention back to the humeral metaphysis where the canal was opened by hand reaming we ultimately broached to a size 6 short stem at 20 degrees of retroversion.  A neutral metaphyseal reaming guide was then used to repair the metaphysis.  A trial implant was placed and trial reduction showed good motion stability and soft tissue balance.  Trial was then removed.  The final implant was assembled.  The canal was irrigated cleaned and dried with vancomycin  powder applied in the canal and the final implant was then seated with excellent fixation.  At this point trial reduction showed +3 poly given as the most motion stability and soft tissue balance.  A final +3 poly was then impacted onto the implant after was cleaned and dried.  Our final reduction showed good motion stability and soft tissue balance all much to our satisfaction.  The wound was copiously irrigated.  The subscapularis was confirmed to have good mobility and was repaired back to the eyelets on the color of the implant using the previously placed suture tape sutures.  Final hemostasis was obtained.  Irrigation completed.  Balance of the vancomycin  powder was spread liberally throughout the deep soft tissue planes.  The deltopectoral interval was reapproximated with a series of figure-of-eight number Vicryl sutures.  2-0 Monocryl used to close the subcu layer and intracuticular 3-0 Monocryl used to close the skin followed by Steri-Strips and an Aquacel dressing.  Right arm placed into a sling.  The patient was awakened, extubated, and taken to the recovery room in stable condition.  Franky CHRISTELLA Pointer MD   Contact # [phone]

## 2024-01-02 NOTE — Discharge Instructions (Signed)

## 2024-01-02 NOTE — Anesthesia Procedure Notes (Signed)
 Procedure Name: Intubation Date/Time: 01/02/2024 8:19 AM  Performed by: Franchot Delon RAMAN, CRNAPre-anesthesia Checklist: Patient identified, Emergency Drugs available, Suction available and Patient being monitored Patient Re-evaluated:Patient Re-evaluated prior to induction Oxygen Delivery Method: Circle System Utilized Preoxygenation: Pre-oxygenation with 100% oxygen Induction Type: IV induction Ventilation: Mask ventilation without difficulty Laryngoscope Size: Mac and 3 Grade View: Grade I Tube type: Oral Tube size: 7.0 mm Number of attempts: 1 Airway Equipment and Method: Stylet Placement Confirmation: ETT inserted through vocal cords under direct vision, positive ETCO2 and breath sounds checked- equal and bilateral Secured at: 21 cm Tube secured with: Tape Dental Injury: Teeth and Oropharynx as per pre-operative assessment

## 2024-01-02 NOTE — Anesthesia Postprocedure Evaluation (Signed)
 Anesthesia Post Note  Patient: Kristen Wilcox  Procedure(s) Performed: ARTHROPLASTY, SHOULDER, TOTAL, REVERSE (Right: Shoulder)     Patient location during evaluation: PACU Anesthesia Type: Regional and General Level of consciousness: awake and alert, oriented and patient cooperative Pain management: pain level controlled Vital Signs Assessment: post-procedure vital signs reviewed and stable Respiratory status: spontaneous breathing, nonlabored ventilation and respiratory function stable Cardiovascular status: blood pressure returned to baseline and stable Postop Assessment: no apparent nausea or vomiting Anesthetic complications: no Comments: Pain meds given for armpit tenderness   No notable events documented.  Last Vitals:  Vitals:   01/02/24 0945 01/02/24 1000  BP: 123/71 121/73  Pulse: 72 69  Resp: 10 11  Temp: (!) 36.4 C   SpO2: 100% 100%    Last Pain:  Vitals:   01/02/24 1006  TempSrc:   PainSc: 6                  [name]

## 2024-01-03 ENCOUNTER — Encounter (HOSPITAL_COMMUNITY): Payer: Self-pay | Admitting: Orthopedic Surgery

## 2024-01-13 NOTE — Addendum Note (Signed)
 Addendum  created 01/13/24 0749 by Merla Almarie HERO, DO   Child order released for a procedure order, Clinical Note Signed, Intraprocedure Blocks edited, SmartForm saved

## 2024-01-13 NOTE — Anesthesia Procedure Notes (Signed)
 Anesthesia Regional Block: Interscalene brachial plexus block   Pre-Anesthetic Checklist: , timeout performed,  Correct Patient, Correct Site, Correct Laterality,  Correct Procedure, Correct Position, site marked,  Risks and benefits discussed,  Surgical consent,  Pre-op evaluation,  At surgeon's request and post-op pain management  Laterality: Right  Prep: Maximum Sterile Barrier Precautions used, chloraprep       Needles:  Injection technique: Single-shot  Needle Type: Echogenic Stimulator Needle     Needle Length: 9cm  Needle Gauge: 22     Additional Needles:   Procedures:,,,, ultrasound used (permanent image in chart),,    Narrative:  Start time: 01/02/2024 7:00 AM End time: 01/02/2024 7:05 AM Injection made incrementally with aspirations every 5 mL.  Performed by: Personally  Anesthesiologist: Merla Almarie HERO, DO  Additional Notes: Monitors applied. No increased pain on injection. No increased resistance to injection. Injection made in 5cc increments. Good needle visualization. Patient tolerated procedure well.

## 2024-03-18 DIAGNOSIS — Z96611 Presence of right artificial shoulder joint: Secondary | ICD-10-CM | POA: Diagnosis not present
